# Patient Record
Sex: Female | Born: 1935
Health system: Southern US, Community
[De-identification: ages and names within clinical notes are randomized; demographics above are authoritative.]

## PROBLEM LIST (undated history)

## (undated) DIAGNOSIS — H3530 Unspecified macular degeneration: Secondary | ICD-10-CM

## (undated) DIAGNOSIS — R002 Palpitations: Secondary | ICD-10-CM

## (undated) DIAGNOSIS — I10 Essential (primary) hypertension: Secondary | ICD-10-CM

## (undated) DIAGNOSIS — K5792 Diverticulitis of intestine, part unspecified, without perforation or abscess without bleeding: Secondary | ICD-10-CM

## (undated) DIAGNOSIS — E785 Hyperlipidemia, unspecified: Secondary | ICD-10-CM

## (undated) HISTORY — PX: CHOLECYSTECTOMY: SHX55

## (undated) HISTORY — PX: ABDOMINAL HYSTERECTOMY: SHX81

## (undated) HISTORY — PX: OOPHORECTOMY: SHX86

## (undated) HISTORY — DX: Palpitations: R00.2

---

## 2007-05-20 LAB — HM COLONOSCOPY

## 2013-05-19 LAB — HM MAMMOGRAPHY: HM Mammogram: NORMAL (ref 0–4)

## 2014-06-24 ENCOUNTER — Encounter: Payer: Self-pay | Admitting: *Deleted

## 2014-06-24 ENCOUNTER — Emergency Department (INDEPENDENT_AMBULATORY_CARE_PROVIDER_SITE_OTHER)
Admission: EM | Admit: 2014-06-24 | Discharge: 2014-06-24 | Disposition: A | Discharge status: Home / Self Care | Payer: Medicare Other | Source: Home / Self Care | Attending: Emergency Medicine | Admitting: Emergency Medicine

## 2014-06-24 ENCOUNTER — Emergency Department (INDEPENDENT_AMBULATORY_CARE_PROVIDER_SITE_OTHER): Payer: Medicare Other

## 2014-06-24 DIAGNOSIS — S60221A Contusion of right hand, initial encounter: Secondary | ICD-10-CM

## 2014-06-24 DIAGNOSIS — M19041 Primary osteoarthritis, right hand: Secondary | ICD-10-CM

## 2014-06-24 HISTORY — DX: Unspecified macular degeneration: H35.30

## 2014-06-24 HISTORY — DX: Essential (primary) hypertension: I10

## 2014-06-24 HISTORY — DX: Hyperlipidemia, unspecified: E78.5

## 2014-06-24 HISTORY — DX: Diverticulitis of intestine, part unspecified, without perforation or abscess without bleeding: K57.92

## 2014-06-24 NOTE — Discharge Instructions (Signed)
Contusion °A contusion is a deep bruise. Contusions are the result of an injury that caused bleeding under the skin. The contusion may turn blue, purple, or yellow. Minor injuries will give you a painless contusion, but more severe contusions may stay painful and swollen for a few weeks.  °CAUSES  °A contusion is usually caused by a blow, trauma, or direct force to an area of the body. °SYMPTOMS  °· Swelling and redness of the injured area. °· Bruising of the injured area. °· Tenderness and soreness of the injured area. °· Pain. °DIAGNOSIS  °The diagnosis can be made by taking a history and physical exam. An X-ray, CT scan, or MRI may be needed to determine if there were any associated injuries, such as fractures. °TREATMENT  °Specific treatment will depend on what area of the body was injured. In general, the best treatment for a contusion is resting, icing, elevating, and applying cold compresses to the injured area. Over-the-counter medicines may also be recommended for pain control. Ask your caregiver what the best treatment is for your contusion. °HOME CARE INSTRUCTIONS  °· Put ice on the injured area. °¨ Put ice in a plastic bag. °¨ Place a towel between your skin and the bag. °¨ Leave the ice on for 15-20 minutes, 3-4 times a day, or as directed by your health care provider. °· Only take over-the-counter or prescription medicines for pain, discomfort, or fever as directed by your caregiver. Your caregiver may recommend avoiding anti-inflammatory medicines (aspirin, ibuprofen, and naproxen) for 48 hours because these medicines may increase bruising. °· Rest the injured area. °· If possible, elevate the injured area to reduce swelling. °SEEK IMMEDIATE MEDICAL CARE IF:  °· You have increased bruising or swelling. °· You have pain that is getting worse. °· Your swelling or pain is not relieved with medicines. °MAKE SURE YOU:  °· Understand these instructions. °· Will watch your condition. °· Will get help right  away if you are not doing well or get worse. °Document Released: 10/27/2004 Document Revised: 01/22/2013 Document Reviewed: 11/22/2010 °[employer]® Patient Information ©2015 [employer]. This information is not intended to replace advice given to you by your health care provider. Make sure you discuss any questions you have with your health care provider. ° °

## 2014-06-24 NOTE — ED Notes (Signed)
Pt c/o LT wrist and hand pain x 2 days post fall. She received a cortisone shot 9/15' in that hand for arthritis.

## 2014-06-24 NOTE — ED Provider Notes (Signed)
CSN: [id number]     Arrival date & time 06/24/14  1301 History   First MD Initiated Contact with Patient 06/24/14 1343     Chief Complaint  Patient presents with  . Hand Injury   (Consider location/radiation/quality/duration/timing/severity/associated sxs/prior Treatment) Patient is a 80 y.o. female presenting with hand pain. The history is provided by the patient. No language interpreter was used.  Hand Pain This is a new problem. The current episode started today. The problem occurs constantly. The problem has been resolved. Associated symptoms include joint swelling and myalgias. Nothing aggravates the symptoms. She has tried nothing for the symptoms. The treatment provided no relief.  Pt reports she fell 2 days ago and hit her wrist.  Pt complains of swelling and pain.  Past Medical History  Diagnosis Date  . Diverticulitis   . Hypertension   . Hyperlipidemia   . Macular degeneration    Past Surgical History  Procedure Laterality Date  . Abdominal hysterectomy    . Cholecystectomy    . Oophorectomy     History reviewed. No pertinent family history. History  Substance Use Topics  . Smoking status: Never Smoker   . Smokeless tobacco: Not on file  . Alcohol Use: No   OB History    No data available     Review of Systems  Musculoskeletal: Positive for myalgias and joint swelling.  All other systems reviewed and are negative.   Allergies  Amoxicillin; Ivp dye; and Sulfur  Home Medications   Prior to Admission medications   Medication Sig Start Date End Date Taking? Authorizing Provider  amikacin (AMIKIN) 0.4 mg/0.1 mL SOLN 0.4 mg by Intravitreal route to Surgery.   Yes Historical Provider, MD  amLODipine (NORVASC) 10 MG tablet Take 10 mg by mouth daily.   Yes Historical Provider, MD  aspirin 81 MG tablet Take 81 mg by mouth daily.   Yes Historical Provider, MD  folic acid (FOLVITE) 1 MG tablet Take 1 mg by mouth daily.   Yes Historical Provider, MD  metoprolol  (LOPRESSOR) 50 MG tablet Take 50 mg by mouth 2 (two) times daily.   Yes Historical Provider, MD  Multiple Vitamins-Minerals (OCUVITE PO) Take by mouth.   Yes Historical Provider, MD  pantoprazole (PROTONIX) 40 MG tablet Take 40 mg by mouth daily.   Yes Historical Provider, MD  pravastatin (PRAVACHOL) 20 MG tablet Take 20 mg by mouth daily.   Yes Historical Provider, MD   BP 146/81 mmHg  Pulse 74  Temp(Src) 98.5 F (36.9 C) (Oral)  Resp 18  Ht 5\' 5"  (1.651 m)  Wt 189 lb (85.73 kg)  BMI 31.45 kg/m2  SpO2 98% Physical Exam  Constitutional: She is oriented to person, place, and time. She appears well-developed and well-nourished.  HENT:  Head: Normocephalic.  Musculoskeletal: She exhibits tenderness.  Tender left thumb,  Tender left thenar prominence.   From,  nv and ns intact  Neurological: She is alert and oriented to person, place, and time. She has normal reflexes.  Skin: Skin is warm.  Psychiatric: She has a normal mood and affect.    ED Course  Procedures (including critical care time) Labs Review Labs Reviewed - No data to display  Imaging Review Dg Hand Complete Right  06/24/2014   CLINICAL DATA:  Acute right hand pain after fall 2 days ago. Initial encounter.  EXAM: RIGHT HAND - COMPLETE 3+ VIEW  COMPARISON:  None.  FINDINGS: There is no evidence of fracture or dislocation. Severe narrowing and  sclerosis of first metacarpophalangeal joint is noted. Soft tissues are unremarkable.  IMPRESSION: Severe osteoarthritis of first metacarpophalangeal joint. No acute abnormality seen in the right hand.   Electronically Signed   By: Marijo Conception, M.D.   On: 06/24/2014 14:13     MDM   1. Contusion of right hand, initial encounter    Wear brace Tylenol for pain See your Orthopaedist for recheck when you return to [facility].  AVS    Fransico Meadow, PA-C 06/27/14 [phone]

## 2015-02-01 HISTORY — PX: TUMOR REMOVAL: SHX12

## 2015-02-04 DIAGNOSIS — M4804 Spinal stenosis, thoracic region: Secondary | ICD-10-CM | POA: Diagnosis not present

## 2015-02-04 DIAGNOSIS — M546 Pain in thoracic spine: Secondary | ICD-10-CM | POA: Diagnosis not present

## 2015-02-04 DIAGNOSIS — M47814 Spondylosis without myelopathy or radiculopathy, thoracic region: Secondary | ICD-10-CM | POA: Diagnosis not present

## 2015-02-04 DIAGNOSIS — M549 Dorsalgia, unspecified: Secondary | ICD-10-CM | POA: Diagnosis not present

## 2015-03-05 DIAGNOSIS — R194 Change in bowel habit: Secondary | ICD-10-CM | POA: Diagnosis not present

## 2015-03-05 DIAGNOSIS — Z86010 Personal history of colonic polyps: Secondary | ICD-10-CM | POA: Diagnosis not present

## 2015-03-05 DIAGNOSIS — K219 Gastro-esophageal reflux disease without esophagitis: Secondary | ICD-10-CM | POA: Diagnosis not present

## 2015-03-11 DIAGNOSIS — H353231 Exudative age-related macular degeneration, bilateral, with active choroidal neovascularization: Secondary | ICD-10-CM | POA: Diagnosis not present

## 2015-03-11 DIAGNOSIS — H353221 Exudative age-related macular degeneration, left eye, with active choroidal neovascularization: Secondary | ICD-10-CM | POA: Diagnosis not present

## 2015-03-11 DIAGNOSIS — H353211 Exudative age-related macular degeneration, right eye, with active choroidal neovascularization: Secondary | ICD-10-CM | POA: Diagnosis not present

## 2015-03-19 DIAGNOSIS — Z6831 Body mass index (BMI) 31.0-31.9, adult: Secondary | ICD-10-CM | POA: Diagnosis not present

## 2015-03-19 DIAGNOSIS — Z1211 Encounter for screening for malignant neoplasm of colon: Secondary | ICD-10-CM | POA: Diagnosis not present

## 2015-03-19 DIAGNOSIS — Z1231 Encounter for screening mammogram for malignant neoplasm of breast: Secondary | ICD-10-CM | POA: Diagnosis not present

## 2015-03-19 DIAGNOSIS — H2513 Age-related nuclear cataract, bilateral: Secondary | ICD-10-CM | POA: Diagnosis not present

## 2015-03-19 DIAGNOSIS — Z13820 Encounter for screening for osteoporosis: Secondary | ICD-10-CM | POA: Diagnosis not present

## 2015-03-19 DIAGNOSIS — R21 Rash and other nonspecific skin eruption: Secondary | ICD-10-CM | POA: Diagnosis not present

## 2015-03-19 DIAGNOSIS — K219 Gastro-esophageal reflux disease without esophagitis: Secondary | ICD-10-CM | POA: Diagnosis not present

## 2015-03-19 DIAGNOSIS — I10 Essential (primary) hypertension: Secondary | ICD-10-CM | POA: Diagnosis not present

## 2015-03-19 DIAGNOSIS — M542 Cervicalgia: Secondary | ICD-10-CM | POA: Diagnosis not present

## 2015-03-19 DIAGNOSIS — I498 Other specified cardiac arrhythmias: Secondary | ICD-10-CM | POA: Diagnosis not present

## 2015-04-01 DIAGNOSIS — R194 Change in bowel habit: Secondary | ICD-10-CM | POA: Diagnosis not present

## 2015-04-01 DIAGNOSIS — Z86010 Personal history of colonic polyps: Secondary | ICD-10-CM | POA: Diagnosis not present

## 2015-04-01 DIAGNOSIS — K219 Gastro-esophageal reflux disease without esophagitis: Secondary | ICD-10-CM | POA: Diagnosis not present

## 2015-04-09 DIAGNOSIS — M50321 Other cervical disc degeneration at C4-C5 level: Secondary | ICD-10-CM | POA: Diagnosis not present

## 2015-04-09 DIAGNOSIS — M4312 Spondylolisthesis, cervical region: Secondary | ICD-10-CM | POA: Diagnosis not present

## 2015-04-09 DIAGNOSIS — M47896 Other spondylosis, lumbar region: Secondary | ICD-10-CM | POA: Diagnosis not present

## 2015-04-09 DIAGNOSIS — M542 Cervicalgia: Secondary | ICD-10-CM | POA: Diagnosis not present

## 2015-04-13 DIAGNOSIS — I10 Essential (primary) hypertension: Secondary | ICD-10-CM | POA: Diagnosis not present

## 2015-04-17 DIAGNOSIS — M546 Pain in thoracic spine: Secondary | ICD-10-CM | POA: Diagnosis not present

## 2015-04-17 DIAGNOSIS — Z0189 Encounter for other specified special examinations: Secondary | ICD-10-CM | POA: Diagnosis not present

## 2015-04-17 DIAGNOSIS — R937 Abnormal findings on diagnostic imaging of other parts of musculoskeletal system: Secondary | ICD-10-CM | POA: Diagnosis not present

## 2015-05-06 DIAGNOSIS — H353221 Exudative age-related macular degeneration, left eye, with active choroidal neovascularization: Secondary | ICD-10-CM | POA: Diagnosis not present

## 2015-05-06 DIAGNOSIS — H353211 Exudative age-related macular degeneration, right eye, with active choroidal neovascularization: Secondary | ICD-10-CM | POA: Diagnosis not present

## 2015-05-06 DIAGNOSIS — H353231 Exudative age-related macular degeneration, bilateral, with active choroidal neovascularization: Secondary | ICD-10-CM | POA: Diagnosis not present

## 2015-06-01 DIAGNOSIS — H3530 Unspecified macular degeneration: Secondary | ICD-10-CM | POA: Diagnosis not present

## 2015-06-10 DIAGNOSIS — D321 Benign neoplasm of spinal meninges: Secondary | ICD-10-CM | POA: Diagnosis not present

## 2015-07-01 DIAGNOSIS — M488X4 Other specified spondylopathies, thoracic region: Secondary | ICD-10-CM | POA: Diagnosis not present

## 2015-07-07 DIAGNOSIS — Z01810 Encounter for preprocedural cardiovascular examination: Secondary | ICD-10-CM | POA: Diagnosis not present

## 2015-07-07 DIAGNOSIS — M488X4 Other specified spondylopathies, thoracic region: Secondary | ICD-10-CM | POA: Diagnosis not present

## 2015-07-07 DIAGNOSIS — Z01818 Encounter for other preprocedural examination: Secondary | ICD-10-CM | POA: Diagnosis not present

## 2015-07-08 DIAGNOSIS — I10 Essential (primary) hypertension: Secondary | ICD-10-CM | POA: Diagnosis present

## 2015-07-08 DIAGNOSIS — M488X4 Other specified spondylopathies, thoracic region: Secondary | ICD-10-CM | POA: Diagnosis not present

## 2015-07-08 DIAGNOSIS — M62838 Other muscle spasm: Secondary | ICD-10-CM | POA: Diagnosis not present

## 2015-07-08 DIAGNOSIS — N3941 Urge incontinence: Secondary | ICD-10-CM | POA: Diagnosis present

## 2015-07-08 DIAGNOSIS — D3610 Benign neoplasm of peripheral nerves and autonomic nervous system, unspecified: Secondary | ICD-10-CM | POA: Diagnosis present

## 2015-07-08 DIAGNOSIS — G959 Disease of spinal cord, unspecified: Secondary | ICD-10-CM | POA: Diagnosis not present

## 2015-07-08 DIAGNOSIS — G9529 Other cord compression: Secondary | ICD-10-CM | POA: Diagnosis present

## 2015-07-08 DIAGNOSIS — Z6832 Body mass index (BMI) 32.0-32.9, adult: Secondary | ICD-10-CM | POA: Diagnosis not present

## 2015-07-08 DIAGNOSIS — K219 Gastro-esophageal reflux disease without esophagitis: Secondary | ICD-10-CM | POA: Diagnosis present

## 2015-07-23 DIAGNOSIS — Z4802 Encounter for removal of sutures: Secondary | ICD-10-CM | POA: Diagnosis not present

## 2015-08-03 DIAGNOSIS — H353221 Exudative age-related macular degeneration, left eye, with active choroidal neovascularization: Secondary | ICD-10-CM | POA: Diagnosis not present

## 2015-08-07 DIAGNOSIS — M5030 Other cervical disc degeneration, unspecified cervical region: Secondary | ICD-10-CM | POA: Diagnosis not present

## 2015-08-07 DIAGNOSIS — R5383 Other fatigue: Secondary | ICD-10-CM | POA: Diagnosis not present

## 2015-08-07 DIAGNOSIS — I495 Sick sinus syndrome: Secondary | ICD-10-CM | POA: Diagnosis not present

## 2015-08-07 DIAGNOSIS — I471 Supraventricular tachycardia: Secondary | ICD-10-CM | POA: Diagnosis not present

## 2015-08-07 DIAGNOSIS — R002 Palpitations: Secondary | ICD-10-CM | POA: Diagnosis not present

## 2015-08-07 DIAGNOSIS — I10 Essential (primary) hypertension: Secondary | ICD-10-CM | POA: Diagnosis not present

## 2015-08-11 DIAGNOSIS — M488X4 Other specified spondylopathies, thoracic region: Secondary | ICD-10-CM | POA: Diagnosis not present

## 2015-08-15 DIAGNOSIS — Z20818 Contact with and (suspected) exposure to other bacterial communicable diseases: Secondary | ICD-10-CM | POA: Diagnosis not present

## 2015-08-15 DIAGNOSIS — J029 Acute pharyngitis, unspecified: Secondary | ICD-10-CM | POA: Diagnosis not present

## 2015-08-15 DIAGNOSIS — Z9889 Other specified postprocedural states: Secondary | ICD-10-CM | POA: Diagnosis not present

## 2015-08-31 DIAGNOSIS — H6123 Impacted cerumen, bilateral: Secondary | ICD-10-CM | POA: Diagnosis not present

## 2015-10-26 DIAGNOSIS — H353231 Exudative age-related macular degeneration, bilateral, with active choroidal neovascularization: Secondary | ICD-10-CM | POA: Diagnosis not present

## 2015-10-26 DIAGNOSIS — H353221 Exudative age-related macular degeneration, left eye, with active choroidal neovascularization: Secondary | ICD-10-CM | POA: Diagnosis not present

## 2015-12-02 DIAGNOSIS — Z23 Encounter for immunization: Secondary | ICD-10-CM | POA: Diagnosis not present

## 2016-01-05 DIAGNOSIS — E039 Hypothyroidism, unspecified: Secondary | ICD-10-CM | POA: Diagnosis not present

## 2016-01-05 DIAGNOSIS — E785 Hyperlipidemia, unspecified: Secondary | ICD-10-CM | POA: Diagnosis not present

## 2016-01-05 DIAGNOSIS — I10 Essential (primary) hypertension: Secondary | ICD-10-CM | POA: Diagnosis not present

## 2016-01-05 LAB — LIPID PANEL
CHOLESTEROL: 165 mg/dL (ref 0–200)
HDL: 53 mg/dL (ref 35–70)
LDL CALC: 90 mg/dL
Triglycerides: 108 mg/dL (ref 40–160)

## 2016-01-05 LAB — CBC AND DIFFERENTIAL
HEMATOCRIT: 45 % (ref 36–46)
Hemoglobin: 14.7 g/dL (ref 12.0–16.0)
NEUTROS ABS: 5 /uL
Platelets: 243 10*3/uL (ref 150–399)
WBC: 6.8 10*3/mL

## 2016-01-05 LAB — BASIC METABOLIC PANEL
BUN: 14 mg/dL (ref 4–21)
Creatinine: 0.8 mg/dL (ref 0.5–1.1)
GLUCOSE: 88 mg/dL
Potassium: 4.4 mmol/L (ref 3.4–5.3)
Sodium: 145 mmol/L (ref 137–147)

## 2016-01-05 LAB — HEPATIC FUNCTION PANEL
ALT: 13 U/L (ref 7–35)
AST: 20 U/L (ref 13–35)
Alkaline Phosphatase: 88 U/L (ref 25–125)
Bilirubin, Total: 1.4 mg/dL

## 2016-01-12 DIAGNOSIS — E7800 Pure hypercholesterolemia, unspecified: Secondary | ICD-10-CM | POA: Diagnosis not present

## 2016-01-12 DIAGNOSIS — K219 Gastro-esophageal reflux disease without esophagitis: Secondary | ICD-10-CM | POA: Diagnosis not present

## 2016-01-12 DIAGNOSIS — I10 Essential (primary) hypertension: Secondary | ICD-10-CM | POA: Diagnosis not present

## 2016-01-12 DIAGNOSIS — I498 Other specified cardiac arrhythmias: Secondary | ICD-10-CM | POA: Diagnosis not present

## 2016-01-12 DIAGNOSIS — Z23 Encounter for immunization: Secondary | ICD-10-CM | POA: Diagnosis not present

## 2016-01-18 DIAGNOSIS — H353211 Exudative age-related macular degeneration, right eye, with active choroidal neovascularization: Secondary | ICD-10-CM | POA: Diagnosis not present

## 2016-01-18 DIAGNOSIS — H353221 Exudative age-related macular degeneration, left eye, with active choroidal neovascularization: Secondary | ICD-10-CM | POA: Diagnosis not present

## 2016-01-18 DIAGNOSIS — H353231 Exudative age-related macular degeneration, bilateral, with active choroidal neovascularization: Secondary | ICD-10-CM | POA: Diagnosis not present

## 2016-01-29 DIAGNOSIS — D1801 Hemangioma of skin and subcutaneous tissue: Secondary | ICD-10-CM | POA: Diagnosis not present

## 2016-01-29 DIAGNOSIS — D045 Carcinoma in situ of skin of trunk: Secondary | ICD-10-CM | POA: Diagnosis not present

## 2016-01-29 DIAGNOSIS — L821 Other seborrheic keratosis: Secondary | ICD-10-CM | POA: Diagnosis not present

## 2016-01-29 DIAGNOSIS — L720 Epidermal cyst: Secondary | ICD-10-CM | POA: Diagnosis not present

## 2016-01-29 DIAGNOSIS — D485 Neoplasm of uncertain behavior of skin: Secondary | ICD-10-CM | POA: Diagnosis not present

## 2016-02-04 DIAGNOSIS — H401111 Primary open-angle glaucoma, right eye, mild stage: Secondary | ICD-10-CM | POA: Diagnosis not present

## 2016-02-04 DIAGNOSIS — H401123 Primary open-angle glaucoma, left eye, severe stage: Secondary | ICD-10-CM | POA: Diagnosis not present

## 2016-02-17 DIAGNOSIS — H401123 Primary open-angle glaucoma, left eye, severe stage: Secondary | ICD-10-CM | POA: Diagnosis not present

## 2016-02-22 DIAGNOSIS — D045 Carcinoma in situ of skin of trunk: Secondary | ICD-10-CM | POA: Diagnosis not present

## 2016-02-29 DIAGNOSIS — H401123 Primary open-angle glaucoma, left eye, severe stage: Secondary | ICD-10-CM | POA: Diagnosis not present

## 2016-03-16 DIAGNOSIS — H6123 Impacted cerumen, bilateral: Secondary | ICD-10-CM | POA: Diagnosis not present

## 2016-03-16 DIAGNOSIS — H903 Sensorineural hearing loss, bilateral: Secondary | ICD-10-CM | POA: Diagnosis not present

## 2016-04-11 DIAGNOSIS — H353211 Exudative age-related macular degeneration, right eye, with active choroidal neovascularization: Secondary | ICD-10-CM | POA: Diagnosis not present

## 2016-04-11 DIAGNOSIS — H353221 Exudative age-related macular degeneration, left eye, with active choroidal neovascularization: Secondary | ICD-10-CM | POA: Diagnosis not present

## 2016-04-11 DIAGNOSIS — H353231 Exudative age-related macular degeneration, bilateral, with active choroidal neovascularization: Secondary | ICD-10-CM | POA: Diagnosis not present

## 2016-04-15 DIAGNOSIS — H401123 Primary open-angle glaucoma, left eye, severe stage: Secondary | ICD-10-CM | POA: Diagnosis not present

## 2016-05-19 ENCOUNTER — Encounter: Payer: Self-pay | Admitting: Family Medicine

## 2016-05-19 ENCOUNTER — Ambulatory Visit (INDEPENDENT_AMBULATORY_CARE_PROVIDER_SITE_OTHER): Payer: Medicare Other | Admitting: Family Medicine

## 2016-05-19 VITALS — BP 135/58 | HR 65 | Ht 64.0 in | Wt 172.0 lb

## 2016-05-19 DIAGNOSIS — I10 Essential (primary) hypertension: Secondary | ICD-10-CM | POA: Diagnosis not present

## 2016-05-19 DIAGNOSIS — K2100 Gastro-esophageal reflux disease with esophagitis, without bleeding: Secondary | ICD-10-CM

## 2016-05-19 DIAGNOSIS — R002 Palpitations: Secondary | ICD-10-CM | POA: Diagnosis not present

## 2016-05-19 DIAGNOSIS — H409 Unspecified glaucoma: Secondary | ICD-10-CM

## 2016-05-19 DIAGNOSIS — E785 Hyperlipidemia, unspecified: Secondary | ICD-10-CM

## 2016-05-19 MED ORDER — PANTOPRAZOLE SODIUM 20 MG PO TBEC: 20.0000 mg | DELAYED_RELEASE_TABLET | Freq: Every day | ORAL | 3 refills | Status: DC

## 2016-05-19 NOTE — Progress Notes (Signed)
Subjective:    Patient ID: Misty Dennis, female    DOB: 12/29/1934, 82 y.o.   MRN: [id number]  HPI 82 year old female comes in today to establish care. She has a history of hypertension as well as palpitations. She has seen cardiologist in the past. She's currently on metoprolol 50 mg and Norvasc 10 mg and has been on this regimen for quite some time. She tolerates it well without any side effects or problems. She's not had any recent chest pain or problems.  GERD-she's been on pantoprazole 40 mg for years. She tried taking it every other day but started to get more reflux symptoms. She said she's had stomach palms her whole life. Recently she has been trying to cut back on her caffeine intake as well as her chocolate intake.  Hyperlipidemia-currently on pravastatin again she's been on that for quite some time. She denies any side effects or myalgias.  She does take a folic acid daily. She says at one point she was told to take it and so has actually been taking it for years. She's not aware of any specific help condition for which she actually uses it.  She also reports multiple eye problems including glaucoma and cataracts. She is currently on several eyedrops and actually has an appointment with a specialist on Monday.    Review of Systems  Constitutional: Negative for diaphoresis, fever and unexpected weight change.       + allergies  HENT: Negative for hearing loss, rhinorrhea and tinnitus.   Eyes: Positive for visual disturbance.  Respiratory: Negative for cough and wheezing.   Cardiovascular: Positive for palpitations. Negative for chest pain.  Gastrointestinal: Negative for blood in stool, diarrhea, nausea and vomiting.  Genitourinary: Negative for difficulty urinating, vaginal bleeding and vaginal discharge.       Occ nausea   Musculoskeletal: Positive for arthralgias and myalgias.  Skin: Negative for rash.  Neurological: Negative for headaches.  Hematological: Negative  for adenopathy. Does not bruise/bleed easily.  Psychiatric/Behavioral: Negative for dysphoric mood and sleep disturbance. The patient is not nervous/anxious.    BP (!) 135/58   Pulse 65   Ht 5\' 4"  (1.626 m)   Wt 172 lb (78 kg)   BMI 29.52 kg/m     Allergies  Allergen Reactions  . Amoxicillin   . Ivp Dye [Iodinated Diagnostic Agents]   . Sulfur     Past Medical History:  Diagnosis Date  . Diverticulitis   . Hyperlipidemia   . Hypertension   . Macular degeneration   . Palpitations     Past Surgical History:  Procedure Laterality Date  . ABDOMINAL HYSTERECTOMY    . CHOLECYSTECTOMY    . OOPHORECTOMY    . TUMOR REMOVAL  2017   [facility], removed from Spine.     Social History   Social History  . Marital status: Married    Spouse name: [name]  . Number of children: 3  . Years of education: 12   Occupational History  . Retired    Social History Main Topics  . Smoking status: Never Smoker  . Smokeless tobacco: Never Used  . Alcohol use No  . Drug use: No  . Sexual activity: Not Currently    Partners: Male   Other Topics Concern  . Not on file   Social History Narrative   No regular exercise. 1 caffeinated drink per day.    Family History  Problem Relation Age of Onset  . Hyperlipidemia Mother   .  Stroke Mother   . Hypertension Mother   . Alcoholism Father     Outpatient Encounter Prescriptions as of 05/19/2016  Medication Sig  . amLODipine (NORVASC) 10 MG tablet Take 10 mg by mouth daily.  Marland Kitchen aspirin 81 MG tablet Take 81 mg by mouth daily.  . Brimonidine Tartrate-Timolol (COMBIGAN OP) Apply to eye 2 (two) times daily.  . folic acid (FOLVITE) 1 MG tablet Take 1 mg by mouth daily.  Marland Kitchen LATANOPROST OP Apply to eye. One drop in left eye  . metoprolol (LOPRESSOR) 50 MG tablet Take 50 mg by mouth 2 (two) times daily.  . pravastatin (PRAVACHOL) 20 MG tablet Take 20 mg by mouth daily.  . [DISCONTINUED] pantoprazole (PROTONIX) 40 MG tablet Take 40 mg by mouth  daily.  . Multiple Vitamins-Minerals (OCUVITE PO) Take by mouth.  . pantoprazole (PROTONIX) 20 MG tablet Take 1 tablet (20 mg total) by mouth daily.  . [DISCONTINUED] amikacin (AMIKIN) 0.4 mg/0.1 mL SOLN 0.4 mg by Intravitreal route to Surgery.   No facility-administered encounter medications on file as of 05/19/2016.          Objective:   Physical Exam  Constitutional: She is oriented to person, place, and time. She appears well-developed and well-nourished.  HENT:  Head: Normocephalic and atraumatic.  Cardiovascular: Normal rate, regular rhythm and normal heart sounds.   Pulmonary/Chest: Effort normal and breath sounds normal.  Neurological: She is alert and oriented to person, place, and time.  Skin: Skin is warm and dry.  Psychiatric: She has a normal mood and affect. Her behavior is normal.        Assessment & Plan:  HTN - Well controlled. Continue current regimen. Follow up in  6 months.    GERD- and try decreasing pantoprazole down to 20 mg to see if she tolerates this well. Will send over new prescription to [facility].  Hyperlipidemia-currently on pravastatin. Could consider switching to atorvastatin.  Glaucoma - Has eye point with specialist on Monday.  History of palpitations-well controlled on beta blocker.

## 2016-05-23 DIAGNOSIS — H401111 Primary open-angle glaucoma, right eye, mild stage: Secondary | ICD-10-CM | POA: Diagnosis not present

## 2016-05-23 DIAGNOSIS — H524 Presbyopia: Secondary | ICD-10-CM | POA: Diagnosis not present

## 2016-05-23 DIAGNOSIS — H353232 Exudative age-related macular degeneration, bilateral, with inactive choroidal neovascularization: Secondary | ICD-10-CM | POA: Diagnosis not present

## 2016-05-23 DIAGNOSIS — H25813 Combined forms of age-related cataract, bilateral: Secondary | ICD-10-CM | POA: Diagnosis not present

## 2016-05-24 ENCOUNTER — Encounter: Payer: Self-pay | Admitting: Family Medicine

## 2016-05-24 NOTE — Progress Notes (Unsigned)
01/05/16 

## 2016-06-02 DIAGNOSIS — H25811 Combined forms of age-related cataract, right eye: Secondary | ICD-10-CM | POA: Diagnosis not present

## 2016-06-02 DIAGNOSIS — H2511 Age-related nuclear cataract, right eye: Secondary | ICD-10-CM | POA: Diagnosis not present

## 2016-07-07 DIAGNOSIS — H25812 Combined forms of age-related cataract, left eye: Secondary | ICD-10-CM | POA: Diagnosis not present

## 2016-07-07 DIAGNOSIS — H268 Other specified cataract: Secondary | ICD-10-CM | POA: Diagnosis not present

## 2016-07-20 DIAGNOSIS — H353231 Exudative age-related macular degeneration, bilateral, with active choroidal neovascularization: Secondary | ICD-10-CM | POA: Diagnosis not present

## 2016-07-20 DIAGNOSIS — H43813 Vitreous degeneration, bilateral: Secondary | ICD-10-CM | POA: Diagnosis not present

## 2016-08-04 DIAGNOSIS — H353231 Exudative age-related macular degeneration, bilateral, with active choroidal neovascularization: Secondary | ICD-10-CM | POA: Diagnosis not present

## 2016-08-16 ENCOUNTER — Encounter: Payer: Self-pay | Admitting: Family Medicine

## 2016-10-18 ENCOUNTER — Ambulatory Visit (INDEPENDENT_AMBULATORY_CARE_PROVIDER_SITE_OTHER): Payer: Medicare Other | Admitting: Family Medicine

## 2016-10-18 VITALS — BP 110/58 | HR 56 | Temp 98.5°F | Wt 178.8 lb

## 2016-10-18 DIAGNOSIS — Z23 Encounter for immunization: Secondary | ICD-10-CM | POA: Diagnosis not present

## 2016-10-18 DIAGNOSIS — I10 Essential (primary) hypertension: Secondary | ICD-10-CM

## 2016-10-18 NOTE — Progress Notes (Addendum)
   Subjective:    Patient ID: Misty Dennis, female    DOB: 07-05-1934, 82 y.o.   MRN: [id number]  HPI Pt is here for flu vaccine. Pt denies egg allergy, fever, chest pain, SOB, or any other problems.    Review of Systems     Objective:   Physical Exam        Assessment & Plan:  Pt tolerated injection in left deltoid well and without complications.  HTN - Blood pressure actually little low today. Decrease amlodipine to half tab daily and keep the appointment in one month.

## 2016-11-03 DIAGNOSIS — H353231 Exudative age-related macular degeneration, bilateral, with active choroidal neovascularization: Secondary | ICD-10-CM | POA: Diagnosis not present

## 2016-11-03 DIAGNOSIS — H43813 Vitreous degeneration, bilateral: Secondary | ICD-10-CM | POA: Diagnosis not present

## 2016-11-13 DIAGNOSIS — G44309 Post-traumatic headache, unspecified, not intractable: Secondary | ICD-10-CM | POA: Diagnosis not present

## 2016-11-13 DIAGNOSIS — Z91041 Radiographic dye allergy status: Secondary | ICD-10-CM | POA: Diagnosis not present

## 2016-11-13 DIAGNOSIS — Z888 Allergy status to other drugs, medicaments and biological substances status: Secondary | ICD-10-CM | POA: Diagnosis not present

## 2016-11-13 DIAGNOSIS — I6782 Cerebral ischemia: Secondary | ICD-10-CM | POA: Diagnosis not present

## 2016-11-13 DIAGNOSIS — W010XXA Fall on same level from slipping, tripping and stumbling without subsequent striking against object, initial encounter: Secondary | ICD-10-CM | POA: Diagnosis not present

## 2016-11-13 DIAGNOSIS — S7002XA Contusion of left hip, initial encounter: Secondary | ICD-10-CM | POA: Diagnosis not present

## 2016-11-13 DIAGNOSIS — G319 Degenerative disease of nervous system, unspecified: Secondary | ICD-10-CM | POA: Diagnosis not present

## 2016-11-13 DIAGNOSIS — I10 Essential (primary) hypertension: Secondary | ICD-10-CM | POA: Diagnosis not present

## 2016-11-13 DIAGNOSIS — S0990XA Unspecified injury of head, initial encounter: Secondary | ICD-10-CM | POA: Diagnosis not present

## 2016-11-13 DIAGNOSIS — Z882 Allergy status to sulfonamides status: Secondary | ICD-10-CM | POA: Diagnosis not present

## 2016-11-13 DIAGNOSIS — S79912A Unspecified injury of left hip, initial encounter: Secondary | ICD-10-CM | POA: Diagnosis not present

## 2016-11-13 DIAGNOSIS — M160 Bilateral primary osteoarthritis of hip: Secondary | ICD-10-CM | POA: Diagnosis not present

## 2016-11-18 ENCOUNTER — Encounter: Payer: Self-pay | Admitting: Family Medicine

## 2016-11-18 ENCOUNTER — Ambulatory Visit (INDEPENDENT_AMBULATORY_CARE_PROVIDER_SITE_OTHER): Payer: Medicare Other | Admitting: Family Medicine

## 2016-11-18 ENCOUNTER — Telehealth: Payer: Self-pay | Admitting: Family Medicine

## 2016-11-18 VITALS — BP 157/62 | HR 64 | Wt 180.0 lb

## 2016-11-18 DIAGNOSIS — R002 Palpitations: Secondary | ICD-10-CM

## 2016-11-18 DIAGNOSIS — M170 Bilateral primary osteoarthritis of knee: Secondary | ICD-10-CM | POA: Diagnosis not present

## 2016-11-18 DIAGNOSIS — M19049 Primary osteoarthritis, unspecified hand: Secondary | ICD-10-CM

## 2016-11-18 DIAGNOSIS — K2100 Gastro-esophageal reflux disease with esophagitis, without bleeding: Secondary | ICD-10-CM

## 2016-11-18 DIAGNOSIS — I10 Essential (primary) hypertension: Secondary | ICD-10-CM

## 2016-11-18 MED ORDER — PRAVASTATIN SODIUM 20 MG PO TABS: 20.0000 mg | ORAL_TABLET | Freq: Every day | ORAL | 1 refills | Status: DC

## 2016-11-18 MED ORDER — AMLODIPINE BESYLATE 10 MG PO TABS: 10.0000 mg | ORAL_TABLET | Freq: Every day | ORAL | 1 refills | Status: DC

## 2016-11-18 MED ORDER — METOPROLOL TARTRATE 50 MG PO TABS: 50.0000 mg | ORAL_TABLET | Freq: Two times a day (BID) | ORAL | 1 refills | Status: DC

## 2016-11-18 MED ORDER — AMBULATORY NON FORMULARY MEDICATION: 0 refills | Status: DC

## 2016-11-18 NOTE — Progress Notes (Signed)
Subjective:    Patient ID: Misty Dennis, female    DOB: [birth year], 82 y.o.   MRN: [id number]  HPI  Hypertension- Pt denies chest pain, SOB, dizziness, or heart palpitations.  Taking meds as directed w/o problems.  Denies medication side effects.  We vaccine decreased her amlodipine down to 5 mg when I saw her last office visit.  GERD- has been able to decrease to the 20mg  protonix and it is working well.   She's been a little stressed and overwhelmed with her husband has Parkinson's disease. He's fallen twice in a short period of time in fact they had to go to urgent care. They thought he had an appointment with me today but he actually did not.  She also wanted to discuss some strategies around her arthritis. She has some pretty severe osteoporosis at the base of the right thumb as well as in her knees. She says at times it can be painful and sore. Right now it's not super bothersome. She has had an injection at the base of the thumb before and says it was extremely painful. She has never had any type of interventional treatment with the knees. She mostly applies Biofreeze at home and says that she does get some temporary relief with that.  She occasionally gets palpitations. She said she was placed on metoprolol years ago for an irregular heartbeat. She says that she gets the episodes sometimes a week or more apart but sometimes can happen multiple times in one week. They usually just last a couple minutes when it occurs and she feels like her heart is of them is skipping a beat. No chest pain with it.   Review of Systems   BP (!) 157/62   Pulse 64   Wt 180 lb (81.6 kg)   BMI 30.90 kg/m     Allergies  Allergen Reactions  . Amoxicillin   . Ivp Dye [Iodinated Diagnostic Agents]   . Sulfur     Past Medical History:  Diagnosis Date  . Diverticulitis   . Hyperlipidemia   . Hypertension   . Macular degeneration   . Palpitations     Past Surgical History:  Procedure Laterality  Date  . ABDOMINAL HYSTERECTOMY    . CHOLECYSTECTOMY    . OOPHORECTOMY    . TUMOR REMOVAL  2017   [facility] clinic, removed from Spine.     Social History   Social History  . Marital status: Married    Spouse name: [name]  . Number of children: 3  . Years of education: 12   Occupational History  . Retired    Social History Main Topics  . Smoking status: Never Smoker  . Smokeless tobacco: Never Used  . Alcohol use No  . Drug use: No  . Sexual activity: Not Currently    Partners: Male   Other Topics Concern  . Not on file   Social History Narrative   No regular exercise. 1 caffeinated drink per day.    Family History  Problem Relation Age of Onset  . Hyperlipidemia Mother   . Stroke Mother   . Hypertension Mother   . Alcoholism Father     Outpatient Encounter Prescriptions as of 11/18/2016  Medication Sig  . amLODipine (NORVASC) 10 MG tablet Take 1 tablet (10 mg total) by mouth daily.  Marland Kitchen aspirin 81 MG tablet Take 81 mg by mouth daily.  . Brimonidine Tartrate-Timolol (COMBIGAN OP) Apply to eye 2 (two) times daily.  . folic  acid (FOLVITE) 1 MG tablet Take 1 mg by mouth daily.  . metoprolol tartrate (LOPRESSOR) 50 MG tablet Take 1 tablet (50 mg total) by mouth 2 (two) times daily.  . Multiple Vitamins-Minerals (OCUVITE PO) Take by mouth.  . pantoprazole (PROTONIX) 20 MG tablet Take 1 tablet (20 mg total) by mouth daily.  . pravastatin (PRAVACHOL) 20 MG tablet Take 1 tablet (20 mg total) by mouth daily.  . [DISCONTINUED] amLODipine (NORVASC) 10 MG tablet Take 10 mg by mouth daily.  . [DISCONTINUED] metoprolol (LOPRESSOR) 50 MG tablet Take 50 mg by mouth 2 (two) times daily.  . [DISCONTINUED] pravastatin (PRAVACHOL) 20 MG tablet Take 20 mg by mouth daily.  . AMBULATORY NON FORMULARY MEDICATION Medication Name: Home Blood pressure cuff.  Dx. Hypertension. FAx to [facility]  . [DISCONTINUED] LATANOPROST OP Apply to eye. One drop in left eye   No facility-administered encounter  medications on file as of 11/18/2016.           Objective:   Physical Exam  Constitutional: She is oriented to person, place, and time. She appears well-developed and well-nourished.  HENT:  Head: Normocephalic and atraumatic.  Cardiovascular: Normal rate, regular rhythm and normal heart sounds.   Pulmonary/Chest: Effort normal and breath sounds normal.  Neurological: She is alert and oriented to person, place, and time.  Skin: Skin is warm and dry.  Psychiatric: She has a normal mood and affect. Her behavior is normal.       Assessment & Plan:  HTN - BP not at goal.  Plan to buy a new blood pressure cuff. Encouraged her to buy an upper arm cuff and not a wrist cuff to track her pressures at home and then bring in the log as well as the blood pressure machine at her follow-up visit. Recheck with nurse in 2 weeks.  Medications refill. It looked great 6 months. Due for CMP and lipid panel. Labs ordered. Continue with 5 mg of amlodipine until we can get her blood pressures checked at home. We cannot get back up to the 10 mg dose which is what she was producing taken if needed.  GERD- Continue with 20mg  daily.    Osteoporosis-we discussed regular exercise and stretches. Using a firm ball to squeeze to strengthen the hand muscles. Certainly if the Biofreeze helpful she can continue that as well and if her pain persists or gets worse and she may be a candidate for an injection. I gave her the information for her to sports medicine docs could always take a further look at her knees and thumb if needed.  Palpitations-we'll keep an eye on things. Offered to schedule her for a 30 day heart monitor particularly if her symptoms seem more frequent or persist. We could always adjust her metoprolol if needed as well.

## 2016-11-18 NOTE — Telephone Encounter (Signed)
Call pt: please cal and ask her to f/u in 2 weeks for nurse visit since her BP was a little high today.

## 2016-11-22 NOTE — Telephone Encounter (Signed)
Patient notified

## 2016-12-05 ENCOUNTER — Ambulatory Visit (INDEPENDENT_AMBULATORY_CARE_PROVIDER_SITE_OTHER): Payer: Medicare Other | Admitting: Family Medicine

## 2016-12-05 VITALS — BP 141/64 | HR 63

## 2016-12-05 DIAGNOSIS — I10 Essential (primary) hypertension: Secondary | ICD-10-CM | POA: Diagnosis not present

## 2016-12-05 NOTE — Progress Notes (Signed)
Pt in for bp check.  Pt brought in her home cuff and the reading was 162/87.  Our machine read 141/64.  Pt states that her Amlodipine was reduced to 5mg  qd.

## 2016-12-05 NOTE — Progress Notes (Signed)
Notified pt to start taking full tab of her Amlodipine and to come back in 2-3 weeks for recheck.

## 2016-12-05 NOTE — Progress Notes (Signed)
Hypertension-uncontrolled.  We will have her go ahead and go back up to 10 mg of the amlodipine.  Okay to send over new prescription if needed for 90-day supply.  Have her come back in 2-3 weeks for repeat blood pressure check if she is able.  Beatrice Lecher, MD

## 2017-02-06 DIAGNOSIS — H353231 Exudative age-related macular degeneration, bilateral, with active choroidal neovascularization: Secondary | ICD-10-CM | POA: Diagnosis not present

## 2017-02-06 DIAGNOSIS — H43813 Vitreous degeneration, bilateral: Secondary | ICD-10-CM | POA: Diagnosis not present

## 2017-02-13 ENCOUNTER — Other Ambulatory Visit: Payer: Self-pay | Admitting: Family Medicine

## 2017-02-14 DIAGNOSIS — H401112 Primary open-angle glaucoma, right eye, moderate stage: Secondary | ICD-10-CM | POA: Diagnosis not present

## 2017-02-14 DIAGNOSIS — H401423 Capsular glaucoma with pseudoexfoliation of lens, left eye, severe stage: Secondary | ICD-10-CM | POA: Diagnosis not present

## 2017-03-13 DIAGNOSIS — M79674 Pain in right toe(s): Secondary | ICD-10-CM | POA: Diagnosis not present

## 2017-03-13 DIAGNOSIS — M79675 Pain in left toe(s): Secondary | ICD-10-CM | POA: Diagnosis not present

## 2017-03-13 DIAGNOSIS — L600 Ingrowing nail: Secondary | ICD-10-CM | POA: Diagnosis not present

## 2017-03-13 DIAGNOSIS — L859 Epidermal thickening, unspecified: Secondary | ICD-10-CM | POA: Diagnosis not present

## 2017-03-21 ENCOUNTER — Telehealth: Payer: Self-pay

## 2017-03-21 MED ORDER — OSELTAMIVIR PHOSPHATE 75 MG PO CAPS: 75.0000 mg | ORAL_CAPSULE | Freq: Every day | ORAL | 0 refills | Status: DC

## 2017-03-21 NOTE — Telephone Encounter (Signed)
Sent the Tamiflu.

## 2017-03-21 NOTE — Telephone Encounter (Signed)
Patient advised.

## 2017-03-21 NOTE — Telephone Encounter (Signed)
Misty Dennis called and she states her husband has been admitted to the hospital with the flu. The only symptoms she has is a scratchy throat. She would like tamiflu sent to pharmacy. Last labs in 2017. Please advise.   CMP Latest Ref Rng & Units 01/05/2016  BUN 4 - 21 mg/dL 14  Creatinine 0.5 - 1.1 mg/dL 0.8  Sodium 137 - 147 mmol/L 145  Potassium 3.4 - 5.3 mmol/L 4.4  Alkaline Phos 25 - 125 U/L 88  AST 13 - 35 U/L 20  ALT 7 - 35 U/L 13

## 2017-04-03 ENCOUNTER — Ambulatory Visit (INDEPENDENT_AMBULATORY_CARE_PROVIDER_SITE_OTHER): Payer: Medicare Other | Admitting: Sports Medicine

## 2017-04-03 ENCOUNTER — Ambulatory Visit (INDEPENDENT_AMBULATORY_CARE_PROVIDER_SITE_OTHER): Payer: Medicare Other

## 2017-04-03 DIAGNOSIS — M1711 Unilateral primary osteoarthritis, right knee: Secondary | ICD-10-CM | POA: Diagnosis not present

## 2017-04-03 DIAGNOSIS — M76892 Other specified enthesopathies of left lower limb, excluding foot: Secondary | ICD-10-CM | POA: Diagnosis not present

## 2017-04-03 DIAGNOSIS — M1811 Unilateral primary osteoarthritis of first carpometacarpal joint, right hand: Secondary | ICD-10-CM

## 2017-04-03 DIAGNOSIS — M19041 Primary osteoarthritis, right hand: Secondary | ICD-10-CM | POA: Diagnosis not present

## 2017-04-03 DIAGNOSIS — M76891 Other specified enthesopathies of right lower limb, excluding foot: Secondary | ICD-10-CM | POA: Diagnosis not present

## 2017-04-03 DIAGNOSIS — M170 Bilateral primary osteoarthritis of knee: Secondary | ICD-10-CM | POA: Insufficient documentation

## 2017-04-03 MED ORDER — MELOXICAM 15 MG PO TABS: ORAL_TABLET | ORAL | 3 refills | Status: DC

## 2017-04-03 NOTE — Assessment & Plan Note (Signed)
Meloxicam, x-rays, injection. Home rehab exercises given. Reaction knee brace. Return to see me in a month.

## 2017-04-03 NOTE — Progress Notes (Signed)
Subjective:    I'm seeing this patient as a consultation for: Dr. Beatrice Lecher  CC: Thumb pain, knee pain  HPI: Right thumb pain: Severe, persistent, localized at the thumb basal joint, she has had an injection several years ago that provided good relief.  She takes Tylenol arthritis strength but no NSAIDs.  No contraindications.  Right knee pain: Moderate, persistent, localized to the medial joint line, positive gelling, no mechanical symptoms, no trauma.  I reviewed the past medical history, family history, social history, surgical history, and allergies today and no changes were needed.  Please see the problem list section below in epic for further details.  Past Medical History: Past Medical History:  Diagnosis Date  . Diverticulitis   . Hyperlipidemia   . Hypertension   . Macular degeneration   . Palpitations    Past Surgical History: Past Surgical History:  Procedure Laterality Date  . ABDOMINAL HYSTERECTOMY    . CHOLECYSTECTOMY    . OOPHORECTOMY    . TUMOR REMOVAL  2017   May clinic, removed from Spine.    Social History: Social History   Socioeconomic History  . Marital status: Married    Spouse name: [name]  . Number of children: 3  . Years of education: 54  . Highest education level: Not on file  Social Needs  . Financial resource strain: Not on file  . Food insecurity - worry: Not on file  . Food insecurity - inability: Not on file  . Transportation needs - medical: Not on file  . Transportation needs - non-medical: Not on file  Occupational History  . Occupation: Retired  Tobacco Use  . Smoking status: Never Smoker  . Smokeless tobacco: Never Used  Substance and Sexual Activity  . Alcohol use: No  . Drug use: No  . Sexual activity: Not Currently    Partners: Male  Other Topics Concern  . Not on file  Social History Narrative   No regular exercise. 1 caffeinated drink per day.   Family History: Family History  Problem Relation Age of  Onset  . Hyperlipidemia Mother   . Stroke Mother   . Hypertension Mother   . Alcoholism Father    Allergies: Allergies  Allergen Reactions  . Amoxicillin   . Ivp Dye [Iodinated Diagnostic Agents]   . Sulfur    Medications: See med rec.  Review of Systems: No headache, visual changes, nausea, vomiting, diarrhea, constipation, dizziness, abdominal pain, skin rash, fevers, chills, night sweats, weight loss, swollen lymph nodes, body aches, joint swelling, muscle aches, chest pain, shortness of breath, mood changes, visual or auditory hallucinations.   Objective:   General: Well Developed, well nourished, and in no acute distress.  Neuro:  Extra-ocular muscles intact, able to move all 4 extremities, sensation grossly intact.  Deep tendon reflexes tested were normal. Psych: Alert and oriented, mood congruent with affect. ENT:  Ears and nose appear unremarkable.  Hearing grossly normal. Neck: Unremarkable overall appearance, trachea midline.  No visible thyroid enlargement. Eyes: Conjunctivae and lids appear unremarkable.  Pupils equal and round. Skin: Warm and dry, no rashes noted.  Cardiovascular: Pulses palpable, no extremity edema. Right knee: Normal to inspection with no erythema or effusion or obvious bony abnormalities. Tender to palpation at the medial joint line ROM normal in flexion and extension and lower leg rotation. Ligaments with solid consistent endpoints including ACL, PCL, LCL, MCL. Negative Mcmurray's and provocative meniscal tests. Non painful patellar compression. Patellar and quadriceps tendons unremarkable. Hamstring and  quadriceps strength is normal. Right hand: Squared off appearance of the thumb basal joint, tenderness at the trapeziometacarpal joint.    Procedure: Real-time Ultrasound Guided Injection of right knee Device: GE Logiq E  Verbal informed consent obtained.  Time-out conducted.  Noted no overlying erythema, induration, or other signs of local  infection.  Skin prepped in a sterile fashion.  Local anesthesia: Topical Ethyl chloride.  With sterile technique and under real time ultrasound guidance: 1 cc kenalog 40, 2 cc lidocaine, 2 cc bupivacaine injected easily Completed without difficulty  Pain immediately resolved suggesting accurate placement of the medication.  Advised to call if fevers/chills, erythema, induration, drainage, or persistent bleeding.  Images permanently stored and available for review in the ultrasound unit.  Impression: Technically successful ultrasound guided injection.  Procedure: Real-time Ultrasound Guided Injection of right 1st carpometacarpal joint Device: GE Logiq E  Verbal informed consent obtained.  Time-out conducted.  Noted no overlying erythema, induration, or other signs of local infection.  Skin prepped in a sterile fashion.  Local anesthesia: Topical Ethyl chloride.  With sterile technique and under real time ultrasound guidance: I had some difficulty getting the needle into the joint, I then injected 1/2 cc kenalog 40, 1/2 cc lidocaine. Completed without difficulty  Pain immediately resolved suggesting accurate placement of the medication.  Advised to call if fevers/chills, erythema, induration, drainage, or persistent bleeding.  Images permanently stored and available for review in the ultrasound unit.  Impression: Technically successful ultrasound guided injection.  Impression and Recommendations:   This case required medical decision making of moderate complexity.  Primary osteoarthritis of first carpometacarpal joint of one hand, right Meloxicam. [facility] injection, x-rays. Return to see me in a month. She does have some triggering of the thumb, we will leave this alone for now.  Primary osteoarthritis of right knee Meloxicam, x-rays, injection. Home rehab exercises given. Reaction knee brace. Return to see me in a month. ___________________________________________ Misty Dennis.  Dianah Field, M.D., ABFM., CAQSM. Primary Care and [place] Instructor of [place] [facility]

## 2017-04-03 NOTE — Assessment & Plan Note (Signed)
Meloxicam. [facility] injection, x-rays. Return to see me in a month. She does have some triggering of the thumb, we will leave this alone for now.

## 2017-05-01 DIAGNOSIS — H43813 Vitreous degeneration, bilateral: Secondary | ICD-10-CM | POA: Diagnosis not present

## 2017-05-01 DIAGNOSIS — H353231 Exudative age-related macular degeneration, bilateral, with active choroidal neovascularization: Secondary | ICD-10-CM | POA: Diagnosis not present

## 2017-05-01 DIAGNOSIS — H31013 Macula scars of posterior pole (postinflammatory) (post-traumatic), bilateral: Secondary | ICD-10-CM | POA: Diagnosis not present

## 2017-05-03 ENCOUNTER — Ambulatory Visit: Payer: Medicare Other | Admitting: Sports Medicine

## 2017-05-16 DIAGNOSIS — I10 Essential (primary) hypertension: Secondary | ICD-10-CM | POA: Diagnosis not present

## 2017-05-16 LAB — COMPLETE METABOLIC PANEL WITH GFR
AG Ratio: 1.9 (calc) (ref 1.0–2.5)
ALT: 24 U/L (ref 6–29)
AST: 20 U/L (ref 10–35)
Albumin: 4.5 g/dL (ref 3.6–5.1)
Alkaline phosphatase (APISO): 85 U/L (ref 33–130)
BUN/Creatinine Ratio: 21 (calc) (ref 6–22)
BUN: 19 mg/dL (ref 7–25)
CALCIUM: 9.5 mg/dL (ref 8.6–10.4)
CO2: 30 mmol/L (ref 20–32)
Chloride: 104 mmol/L (ref 98–110)
Creat: 0.92 mg/dL — ABNORMAL HIGH (ref 0.60–0.88)
GFR, EST AFRICAN AMERICAN: 68 mL/min/{1.73_m2} (ref >=60)
GFR, EST NON AFRICAN AMERICAN: 59 mL/min/{1.73_m2} — AB (ref >=60)
Globulin: 2.4 g/dL (calc) (ref 1.9–3.7)
Glucose, Bld: 92 mg/dL (ref 65–99)
Potassium: 4.3 mmol/L (ref 3.5–5.3)
Sodium: 142 mmol/L (ref 135–146)
TOTAL PROTEIN: 6.9 g/dL (ref 6.1–8.1)
Total Bilirubin: 0.9 mg/dL (ref 0.2–1.2)

## 2017-05-16 LAB — LIPID PANEL W/REFLEX DIRECT LDL
Cholesterol: 179 mg/dL (ref <200)
HDL: 55 mg/dL (ref >50)
LDL Cholesterol (Calc): 105 mg/dL (calc) — ABNORMAL HIGH
Non-HDL Cholesterol (Calc): 124 mg/dL (calc) (ref <130)
TRIGLYCERIDES: 99 mg/dL (ref <150)
Total CHOL/HDL Ratio: 3.3 (calc) (ref <5.0)

## 2017-05-19 ENCOUNTER — Ambulatory Visit: Payer: Medicare Other | Admitting: Family Medicine

## 2017-05-22 ENCOUNTER — Ambulatory Visit (INDEPENDENT_AMBULATORY_CARE_PROVIDER_SITE_OTHER): Payer: Medicare Other | Admitting: Family Medicine

## 2017-05-22 ENCOUNTER — Encounter: Payer: Self-pay | Admitting: Family Medicine

## 2017-05-22 VITALS — BP 101/50 | HR 55 | Ht 64.0 in | Wt 178.0 lb

## 2017-05-22 DIAGNOSIS — I10 Essential (primary) hypertension: Secondary | ICD-10-CM

## 2017-05-22 DIAGNOSIS — K2100 Gastro-esophageal reflux disease with esophagitis, without bleeding: Secondary | ICD-10-CM

## 2017-05-22 DIAGNOSIS — H6123 Impacted cerumen, bilateral: Secondary | ICD-10-CM

## 2017-05-22 DIAGNOSIS — K449 Diaphragmatic hernia without obstruction or gangrene: Secondary | ICD-10-CM | POA: Diagnosis not present

## 2017-05-22 DIAGNOSIS — H9202 Otalgia, left ear: Secondary | ICD-10-CM

## 2017-05-22 MED ORDER — OMEPRAZOLE 20 MG PO CPDR: 20.0000 mg | DELAYED_RELEASE_CAPSULE | Freq: Every day | ORAL | 3 refills | Status: DC

## 2017-05-22 NOTE — Progress Notes (Signed)
Subjective:    Patient ID: Misty Dennis, female    DOB: 12/14/1934, 83 y.o.   MRN: [id number]  HPI Hypertension- Pt denies chest pain, SOB, dizziness, or heart palpitations.  Taking meds as directed w/o problems.  Denies medication side effects.  She did bring in her home blood pressure cuff today to compare to ours.  She also wanted to discuss her pantoprazole.  She went to get it filled at the [place] and they would not fill it.  Evidently it can cause watery stools and leg cramps which she does experience periodically.  But she is also had chronic reflux symptoms for at least 20 years when she was initially diagnosed with a hiatal hernia she was having severe reflux at the time.  She is actually been off of any PPI for about 3 days and just used a little Maalox in between.  She is also been trying to eat smaller meals which has been helping as well.  Left ear pain  - She has been using drops OTC but not sure what they are. says it has been itching but no drinage. Thinks it may be stopped up again. Marland Kitchen   He did have some questions about continuing her aspirin.  She heard some things on the news about it.  She is not currently taking it prior history of MI or stroke.  She also wanted to let me know that the anti-inflammatory, meloxicam that Dr. Dianah Field had given her at her last appointment caused her to vomit.  Review of Systems  BP (!) 101/50   Pulse (!) 55   Ht 5\' 4"  (1.626 m)   Wt 178 lb (80.7 kg)   SpO2 99%   BMI 30.55 kg/m     Allergies  Allergen Reactions  . Amoxicillin   . Ivp Dye [Iodinated Diagnostic Agents]   . Meloxicam Nausea And Vomiting  . Sulfur     Past Medical History:  Diagnosis Date  . Diverticulitis   . Hyperlipidemia   . Hypertension   . Macular degeneration   . Palpitations     Past Surgical History:  Procedure Laterality Date  . ABDOMINAL HYSTERECTOMY    . CHOLECYSTECTOMY    . OOPHORECTOMY    . TUMOR REMOVAL  2017   May clinic, removed from  Spine.     Social History   Socioeconomic History  . Marital status: Married    Spouse name: [name]  . Number of children: 3  . Years of education: 26  . Highest education level: Not on file  Occupational History  . Occupation: Retired  Scientific laboratory technician  . Financial resource strain: Not on file  . Food insecurity:    Worry: Not on file    Inability: Not on file  . Transportation needs:    Medical: Not on file    Non-medical: Not on file  Tobacco Use  . Smoking status: Never Smoker  . Smokeless tobacco: Never Used  Substance and Sexual Activity  . Alcohol use: No  . Drug use: No  . Sexual activity: Not Currently    Partners: Male  Lifestyle  . Physical activity:    Days per week: Not on file    Minutes per session: Not on file  . Stress: Not on file  Relationships  . Social connections:    Talks on phone: Not on file    Gets together: Not on file    Attends religious service: Not on file    Active  member of club or organization: Not on file    Attends meetings of clubs or organizations: Not on file    Relationship status: Not on file  . Intimate partner violence:    Fear of current or ex partner: Not on file    Emotionally abused: Not on file    Physically abused: Not on file    Forced sexual activity: Not on file  Other Topics Concern  . Not on file  Social History Narrative   No regular exercise. 1 caffeinated drink per day.    Family History  Problem Relation Age of Onset  . Hyperlipidemia Mother   . Stroke Mother   . Hypertension Mother   . Alcoholism Father     Outpatient Encounter Medications as of 05/22/2017  Medication Sig  . AMBULATORY NON FORMULARY MEDICATION Medication Name: Home Blood pressure cuff.  Dx. Hypertension. [facility]x to [facility]  . amLODipine (NORVASC) 10 MG tablet Take 1 tablet (10 mg total) by mouth daily.  . Brimonidine Tartrate-Timolol (COMBIGAN OP) Apply to eye 2 (two) times daily.  . folic acid (FOLVITE) 1 MG tablet Take 1 mg by mouth  daily.  . meloxicam (MOBIC) 15 MG tablet One tab PO qAM with breakfast for 2 weeks, then daily prn pain.  . metoprolol tartrate (LOPRESSOR) 50 MG tablet Take 1 tablet (50 mg total) by mouth 2 (two) times daily.  . Multiple Vitamins-Minerals (OCUVITE PO) Take by mouth.  . pantoprazole (PROTONIX) 20 MG tablet Take 1 tablet (20 mg total) by mouth daily.  . pravastatin (PRAVACHOL) 20 MG tablet Take 1 tablet (20 mg total) by mouth daily.  . [DISCONTINUED] aspirin 81 MG tablet Take 81 mg by mouth daily.  Marland Kitchen omeprazole (PRILOSEC) 20 MG capsule Take 1 capsule (20 mg total) by mouth daily.  . [DISCONTINUED] oseltamivir (TAMIFLU) 75 MG capsule Take 1 capsule (75 mg total) by mouth daily. X 10 days   No facility-administered encounter medications on file as of 05/22/2017.           Objective:   Physical Exam  Constitutional: She is oriented to person, place, and time. She appears well-developed and well-nourished.  HENT:  Head: Normocephalic and atraumatic.  Left ear canal is  Blocked by cerumen.  Right is mostly blocked by cerumen.   Cardiovascular: Normal rate, regular rhythm and normal heart sounds.  Pulmonary/Chest: Effort normal and breath sounds normal.  Neurological: She is alert and oriented to person, place, and time.  Skin: Skin is warm and dry.  Psychiatric: She has a normal mood and affect. Her behavior is normal.          Assessment & Plan:  HTN - Well controlled. Continue current regimen. Follow up in  60months.    Left ear pain -blocked by cerumen.  She says normally she does not get any improvement with irrigation so we will go ahead and refer her to ENT for cerumen removal.  GERD with Hiatal hernia - discussed options.  Work on trying to decrease PPI use.  Continue to work on eating small meals.  Medication side effect-added meloxicam to her intolerance list.  I do not think it was an allergy as it was an intolerance.  Okay to discontinue her daily aspirin.

## 2017-05-22 NOTE — Patient Instructions (Addendum)
Ok to stop your Aspirin Ok to use Tylenol arthritis up to 3 times a day as needed for pain relief of your joints. Prescription sent for omeprazole to [facility] but I would try to use it infrequently. Try Debrox wax softening drops for your ears.

## 2017-06-09 DIAGNOSIS — H6123 Impacted cerumen, bilateral: Secondary | ICD-10-CM | POA: Diagnosis not present

## 2017-06-14 ENCOUNTER — Telehealth: Payer: Self-pay | Admitting: Family Medicine

## 2017-06-14 MED ORDER — PRAVASTATIN SODIUM 20 MG PO TABS
20.0000 mg | ORAL_TABLET | Freq: Every day | ORAL | 1 refills | Status: DC
Start: 2017-06-14 — End: 2018-04-03

## 2017-06-14 MED ORDER — METOPROLOL TARTRATE 50 MG PO TABS: 50.0000 mg | ORAL_TABLET | Freq: Two times a day (BID) | ORAL | 1 refills | Status: DC

## 2017-06-14 MED ORDER — AMLODIPINE BESYLATE 10 MG PO TABS
10.0000 mg | ORAL_TABLET | Freq: Every day | ORAL | 1 refills | Status: DC
Start: 2017-06-14 — End: 2017-11-27

## 2017-06-14 NOTE — Telephone Encounter (Signed)
Refills sent to [facility]. Left VM advising Pt of status update.

## 2017-06-14 NOTE — Telephone Encounter (Signed)
Patient is requesting refills on 90 days supply of metroprolol tartrate, amlodipine, and pravastatin. Patient stated she has a little over a week of meds left, and she receives these meds through mail order. Please advise patient. Thank you!

## 2017-07-31 DIAGNOSIS — H43813 Vitreous degeneration, bilateral: Secondary | ICD-10-CM | POA: Diagnosis not present

## 2017-07-31 DIAGNOSIS — H31013 Macula scars of posterior pole (postinflammatory) (post-traumatic), bilateral: Secondary | ICD-10-CM | POA: Diagnosis not present

## 2017-07-31 DIAGNOSIS — H353231 Exudative age-related macular degeneration, bilateral, with active choroidal neovascularization: Secondary | ICD-10-CM | POA: Diagnosis not present

## 2017-08-16 DIAGNOSIS — H401112 Primary open-angle glaucoma, right eye, moderate stage: Secondary | ICD-10-CM | POA: Diagnosis not present

## 2017-08-16 DIAGNOSIS — H52203 Unspecified astigmatism, bilateral: Secondary | ICD-10-CM | POA: Diagnosis not present

## 2017-08-16 DIAGNOSIS — H401423 Capsular glaucoma with pseudoexfoliation of lens, left eye, severe stage: Secondary | ICD-10-CM | POA: Diagnosis not present

## 2017-10-11 ENCOUNTER — Ambulatory Visit: Payer: Medicare Other

## 2017-10-23 ENCOUNTER — Ambulatory Visit (INDEPENDENT_AMBULATORY_CARE_PROVIDER_SITE_OTHER): Payer: Medicare Other | Admitting: Family Medicine

## 2017-10-23 DIAGNOSIS — Z23 Encounter for immunization: Secondary | ICD-10-CM | POA: Diagnosis not present

## 2017-11-06 DIAGNOSIS — H353231 Exudative age-related macular degeneration, bilateral, with active choroidal neovascularization: Secondary | ICD-10-CM | POA: Diagnosis not present

## 2017-11-06 DIAGNOSIS — H31113 Age-related choroidal atrophy, bilateral: Secondary | ICD-10-CM | POA: Diagnosis not present

## 2017-11-06 DIAGNOSIS — H43813 Vitreous degeneration, bilateral: Secondary | ICD-10-CM | POA: Diagnosis not present

## 2017-11-06 DIAGNOSIS — H35433 Paving stone degeneration of retina, bilateral: Secondary | ICD-10-CM | POA: Diagnosis not present

## 2017-11-24 DIAGNOSIS — H6123 Impacted cerumen, bilateral: Secondary | ICD-10-CM | POA: Diagnosis not present

## 2017-11-27 ENCOUNTER — Encounter: Payer: Self-pay | Admitting: Family Medicine

## 2017-11-27 ENCOUNTER — Ambulatory Visit (INDEPENDENT_AMBULATORY_CARE_PROVIDER_SITE_OTHER): Payer: Medicare Other | Admitting: Family Medicine

## 2017-11-27 ENCOUNTER — Ambulatory Visit (INDEPENDENT_AMBULATORY_CARE_PROVIDER_SITE_OTHER): Payer: Medicare Other

## 2017-11-27 VITALS — BP 116/67 | HR 58 | Ht 62.5 in | Wt 182.0 lb

## 2017-11-27 DIAGNOSIS — M549 Dorsalgia, unspecified: Secondary | ICD-10-CM | POA: Diagnosis not present

## 2017-11-27 DIAGNOSIS — I10 Essential (primary) hypertension: Secondary | ICD-10-CM | POA: Diagnosis not present

## 2017-11-27 DIAGNOSIS — K2100 Gastro-esophageal reflux disease with esophagitis, without bleeding: Secondary | ICD-10-CM

## 2017-11-27 DIAGNOSIS — M542 Cervicalgia: Secondary | ICD-10-CM | POA: Diagnosis not present

## 2017-11-27 DIAGNOSIS — M47892 Other spondylosis, cervical region: Secondary | ICD-10-CM

## 2017-11-27 MED ORDER — OMEPRAZOLE 20 MG PO CPDR: 20.0000 mg | DELAYED_RELEASE_CAPSULE | Freq: Every day | ORAL | 3 refills | Status: AC

## 2017-11-27 MED ORDER — METOPROLOL TARTRATE 50 MG PO TABS: 50.0000 mg | ORAL_TABLET | Freq: Two times a day (BID) | ORAL | 1 refills | Status: DC

## 2017-11-27 MED ORDER — AMLODIPINE BESYLATE 10 MG PO TABS: 10.0000 mg | ORAL_TABLET | Freq: Every day | ORAL | 1 refills | Status: DC

## 2017-11-27 NOTE — Progress Notes (Signed)
Subjective:    CC:   HPI:  Hypertension- Pt denies chest pain, SOB, dizziness, or heart palpitations.  Taking meds as directed w/o problems.  Denies medication side effects.    F/U GERD -she just takes her omeprazole as needed on average about 2-3 times per week and this seems to control her reflux symptoms.  She also pt reports upper back pain x 1 mo. she has had a previous back surgery for benign tumor removal in the thoracic spine.  At the time she was told that she did have some stenosis as well.  She was told that the tumors were very slow-growing and benign and that she would likely not have to have any repeat surgery in her lifetime.. she stated that it bothers her when she either walks long distances or stands for long periods of time. she has been using tylenol arthritis which helps some.  Heat helps as well.  It does seem to be better when she is sitting and resting and worse when she is doing a lot of activities like washing dishes or walking.  Denies any numbness or tingling in the arms.  Past medical history, Surgical history, Family history not pertinant except as noted below, Social history, Allergies, and medications have been entered into the medical record, reviewed, and corrections made.   Review of Systems: No fevers, chills, night sweats, weight loss, chest pain, or shortness of breath.   Objective:    General: Well Developed, well nourished, and in no acute distress.  Neuro: Alert and oriented x3, extra-ocular muscles intact, sensation grossly intact.  HEENT: Normocephalic, atraumatic  Skin: Warm and dry, no rashes.  Indented vertical surgical scar that is quite lengthy over the thoracic spine. Cardiac: Regular rate and rhythm, no murmurs rubs or gallops, no lower extremity edema.  Respiratory: Clear to auscultation bilaterally. Not using accessory muscles, speaking in full sentences.   Impression and Recommendations:   HTN - Well controlled. Continue current regimen.  Follow up in  6 mo    GERD -just using PPI as needed which is great.  Upper back pain worse over the last year with a history of spinal stenosis.  Will get plain film x-rays today just to rule out any other potential causes like compression fracture etc.  Continue with Tylenol and heat as needed since this does seem to provide pain relief for her.  Consider MRI if pain continues.

## 2017-11-28 LAB — BASIC METABOLIC PANEL WITH GFR
BUN: 17 mg/dL (ref 7–25)
CO2: 30 mmol/L (ref 20–32)
Calcium: 9.6 mg/dL (ref 8.6–10.4)
Chloride: 103 mmol/L (ref 98–110)
Creat: 0.82 mg/dL (ref 0.60–0.88)
GFR, Est African American: 78 mL/min/{1.73_m2} (ref >=60)
GFR, Est Non African American: 67 mL/min/{1.73_m2} (ref >=60)
Glucose, Bld: 93 mg/dL (ref 65–139)
POTASSIUM: 4.6 mmol/L (ref 3.5–5.3)
SODIUM: 142 mmol/L (ref 135–146)

## 2017-11-28 NOTE — Progress Notes (Signed)
All labs are normal. 

## 2017-12-06 ENCOUNTER — Ambulatory Visit (INDEPENDENT_AMBULATORY_CARE_PROVIDER_SITE_OTHER): Payer: Medicare Other

## 2017-12-06 DIAGNOSIS — M419 Scoliosis, unspecified: Secondary | ICD-10-CM | POA: Diagnosis not present

## 2017-12-06 DIAGNOSIS — M549 Dorsalgia, unspecified: Secondary | ICD-10-CM

## 2017-12-06 DIAGNOSIS — M546 Pain in thoracic spine: Secondary | ICD-10-CM | POA: Diagnosis not present

## 2017-12-13 ENCOUNTER — Ambulatory Visit: Payer: Medicare Other | Admitting: Family Medicine

## 2017-12-22 ENCOUNTER — Ambulatory Visit: Payer: Medicare Other | Admitting: Family Medicine

## 2018-01-12 ENCOUNTER — Ambulatory Visit: Payer: Medicare Other | Admitting: Family Medicine

## 2018-02-12 DIAGNOSIS — H31113 Age-related choroidal atrophy, bilateral: Secondary | ICD-10-CM | POA: Diagnosis not present

## 2018-02-12 DIAGNOSIS — H43813 Vitreous degeneration, bilateral: Secondary | ICD-10-CM | POA: Diagnosis not present

## 2018-02-12 DIAGNOSIS — H353231 Exudative age-related macular degeneration, bilateral, with active choroidal neovascularization: Secondary | ICD-10-CM | POA: Diagnosis not present

## 2018-02-12 DIAGNOSIS — H31013 Macula scars of posterior pole (postinflammatory) (post-traumatic), bilateral: Secondary | ICD-10-CM | POA: Diagnosis not present

## 2018-02-19 DIAGNOSIS — H401423 Capsular glaucoma with pseudoexfoliation of lens, left eye, severe stage: Secondary | ICD-10-CM | POA: Diagnosis not present

## 2018-02-19 DIAGNOSIS — H401111 Primary open-angle glaucoma, right eye, mild stage: Secondary | ICD-10-CM | POA: Diagnosis not present

## 2018-02-27 ENCOUNTER — Encounter: Payer: Self-pay | Admitting: Family Medicine

## 2018-02-27 ENCOUNTER — Ambulatory Visit (INDEPENDENT_AMBULATORY_CARE_PROVIDER_SITE_OTHER): Payer: Medicare Other | Admitting: Family Medicine

## 2018-02-27 VITALS — BP 157/50 | HR 60 | Ht 63.0 in | Wt 177.0 lb

## 2018-02-27 DIAGNOSIS — M546 Pain in thoracic spine: Secondary | ICD-10-CM | POA: Diagnosis not present

## 2018-02-27 DIAGNOSIS — M542 Cervicalgia: Secondary | ICD-10-CM

## 2018-02-27 DIAGNOSIS — I10 Essential (primary) hypertension: Secondary | ICD-10-CM

## 2018-02-27 NOTE — Progress Notes (Signed)
Subjective:    CC: go over xray from November.   HPI:  84 yo female is here to go over her xray done on 11/6 for her upper back and 10/28 for her neck. She is still getting some pain, stiffness and dec ROm of her neck esp on her left side right now.  She wanted to know the next steps.    Hypertension- Pt denies chest pain, SOB, dizziness, or heart palpitations.  Taking meds as directed w/o problems.  Denies medication side effects.    EXAM: THORACIC SPINE - 3 VIEWS  COMPARISON:  None  FINDINGS: Diffuse osseous demineralization.  Twelve pairs of ribs.  Biconvex thoracolumbar scoliosis.  Vertebral body heights maintained without fracture or subluxation.  Scattered endplate spur formation and minimal disc space narrowing.  Visualized posterior ribs unremarkable.  IMPRESSION: Osseous demineralization with biconvex thoracolumbar scoliosis and mild scattered degenerative disc disease changes.  No acute abnormalities.   Electronically Signed   By: Lavonia Dana M.D.   On: 12/07/2017 08:15   Objective:    General: Well Developed, well nourished, and in no acute distress.  Neuro: Alert and oriented x3, extra-ocular muscles intact, sensation grossly intact.  HEENT: Normocephalic, atraumatic  Skin: Warm and dry, no rashes. Cardiac: Regular rate and rhythm, no murmurs rubs or gallops, no lower extremity edema.  Respiratory: Clear to auscultation bilaterally. Not using accessory muscles, speaking in full sentences.   Impression and Recommendations:   Cervical and throacic pain - Discussed arthritis and DDD and some spasm seen on xrays. Plan to refer to PT for treatment.    HTN - not well controlled today but BP normal at last OV. Will have her f/u for nurse visit in 2 weeks.

## 2018-03-01 ENCOUNTER — Encounter: Payer: Self-pay | Admitting: Family Medicine

## 2018-03-01 ENCOUNTER — Telehealth: Payer: Self-pay | Admitting: Family Medicine

## 2018-03-01 ENCOUNTER — Ambulatory Visit: Payer: Medicare Other | Admitting: Physical Therapy

## 2018-03-01 NOTE — Telephone Encounter (Signed)
Since BP was high at OV can she come in in 2 weeks for nurse BP check?

## 2018-03-02 NOTE — Telephone Encounter (Signed)
Patient scheduled.

## 2018-03-07 ENCOUNTER — Encounter: Payer: Self-pay | Admitting: Physical Therapy

## 2018-03-07 ENCOUNTER — Other Ambulatory Visit: Payer: Self-pay

## 2018-03-07 ENCOUNTER — Ambulatory Visit (INDEPENDENT_AMBULATORY_CARE_PROVIDER_SITE_OTHER): Payer: Medicare Other | Admitting: Physical Therapy

## 2018-03-07 ENCOUNTER — Ambulatory Visit (INDEPENDENT_AMBULATORY_CARE_PROVIDER_SITE_OTHER): Payer: Medicare Other | Admitting: Osteopathic Medicine

## 2018-03-07 VITALS — BP 130/56 | HR 68 | Temp 98.4°F | Wt 179.0 lb

## 2018-03-07 DIAGNOSIS — M546 Pain in thoracic spine: Secondary | ICD-10-CM | POA: Diagnosis not present

## 2018-03-07 DIAGNOSIS — R293 Abnormal posture: Secondary | ICD-10-CM

## 2018-03-07 DIAGNOSIS — M542 Cervicalgia: Secondary | ICD-10-CM | POA: Diagnosis not present

## 2018-03-07 DIAGNOSIS — I10 Essential (primary) hypertension: Secondary | ICD-10-CM

## 2018-03-07 NOTE — Patient Instructions (Signed)
Access Code: [email or web address]  URL: [email or web address]  Date: 03/07/2018  Prepared by: Faustino Congress   Exercises  Supine Chin Tuck - 10 reps - 1 sets - 5 sec hold - 2x daily - 7x weekly  Standing Backward Shoulder Rolls - 10 reps - 1 sets - 2x daily - 7x weekly  Seated Scapular Retraction - 10 reps - 1 sets - 5 sec hold - 2x daily - 7x weekly  Doorway Pec Stretch at 90 Degrees Abduction - 3 reps - 1 sets - 30 sec hold - 1x daily - 7x weekly  Patient Education  TENS Unit

## 2018-03-07 NOTE — Therapy (Addendum)
[place], [place], [address] Phone: [phone]   Fax:  [phone]  Physical Therapy Evaluation  Patient Details  Name: Misty Dennis MRN: [id number] Date of Birth: 10-Jun-1934 Referring Provider (PT): Hali Marry, MD   Encounter Date: 03/07/2018  PT End of Session - 03/07/18 1511    Visit Number  1    Number of Visits  12    Date for PT Re-Evaluation  04/18/18    PT Start Time  6734    PT Stop Time  1521    PT Time Calculation (min)  56 min    Activity Tolerance  Patient tolerated treatment well    Behavior During Therapy  [facility] for tasks assessed/performed       Past Medical History:  Diagnosis Date  . Diverticulitis   . Hyperlipidemia   . Hypertension   . Macular degeneration   . Palpitations     Past Surgical History:  Procedure Laterality Date  . ABDOMINAL HYSTERECTOMY    . CHOLECYSTECTOMY    . OOPHORECTOMY    . TUMOR REMOVAL  2017   May clinic, removed from Spine.     There were no vitals filed for this visit.   Subjective Assessment - 03/07/18 1429    Subjective  Pt is an 84 y/o female who presents to OPPT for neck and upper back pain x a few years.     Pertinent History  removal of 3 benign tumors in spine (thoracic/lumbar) (2017)    How long can you stand comfortably?  30-45 min    Diagnostic tests  xrays: arthritis    Patient Stated Goals  improve pain    Currently in Pain?  Yes    Pain Score  2    at best 1/10; up to 8/10   Pain Location  Neck    Pain Orientation  Posterior    Pain Descriptors / Indicators  Aching    Pain Type  Chronic pain    Pain Onset  More than a month ago    Pain Frequency  Constant    Aggravating Factors   standing, lifting and carrying heavy items    Pain Relieving Factors  heat, massage, movement, sitting to rest         [facility] PT Assessment - 03/07/18 1435      Assessment   Medical Diagnosis  M54.2 (ICD-10-CM) - Cervical  pain, M54.6 (ICD-10-CM) - Thoracic back pain, unspecified back pain laterality, unspecified chronicity    Referring Provider (PT)  Hali Marry, MD    Onset Date/Surgical Date  --   chronic   Hand Dominance  Right    Next MD Visit  April 2020    Prior Therapy  many years ago      Precautions   Precautions  None      Restrictions   Weight Bearing Restrictions  No      Balance Screen   Has the patient fallen in the past 6 months  No    Has the patient had a decrease in activity level because of a fear of falling?   No    Is the patient reluctant to leave their home because of a fear of falling?   No      Home Environment   Living Environment  Private residence    Living Arrangements  Spouse/significant other    Type of [place] Access  Elevator;Level  entry    [place]  One level      Prior Function   Level of [place]  Retired    Leisure  spend time with neighbors; family; has HEP for knee but no regular exercise      Cognition   Overall Cognitive Status  Within Functional Limits for tasks assessed      Observation/Other Assessments   Focus on Therapeutic Outcomes (FOTO)   67 (33% limited; predicted 36% limited)      Posture/Postural Control   Posture/Postural Control  Postural limitations    Postural Limitations  Rounded Shoulders;Forward head;Increased thoracic kyphosis      ROM / Strength   AROM / PROM / Strength  AROM;Strength      AROM   Overall AROM Comments  mild pain with sidebending and Lt rotation    AROM Assessment Site  Cervical    Cervical Flexion  42    Cervical Extension  42    Cervical - Right Side Bend  30    Cervical - Left Side Bend  26    Cervical - Right Rotation  52    Cervical - Left Rotation  55      Strength   Strength Assessment Site  Shoulder    Right/Left Shoulder  Right;Left    Right Shoulder Flexion  4/5    Right Shoulder ABduction  4/5    Right Shoulder Internal Rotation   5/5    Right Shoulder External Rotation  5/5    Left Shoulder Flexion  4/5    Left Shoulder ABduction  4/5    Left Shoulder Internal Rotation  5/5    Left Shoulder External Rotation  5/5      Palpation   Palpation comment  trigger points noted in cervical paraspinals, upper traps, levator scapulae, rhomboids      Special Tests    Special Tests  Cervical    Cervical Tests  Spurling's;Dictraction      Spurling's   Findings  Negative      Distraction Test   Findngs  Negative                Objective measurements completed on examination: See above findings.      [facility] Adult PT Treatment/Exercise - 03/07/18 1435      Exercises   Exercises  Neck      Neck Exercises: Seated   Shoulder Rolls  10 reps;Backwards    Other Seated Exercise  scapular retraction 5x10 sec      Neck Exercises: Supine   Neck Retraction  1 rep;5 secs      Neck Exercises: Stretches   Other Neck Stretches  doorway stretch x 30 sec       Addendum: estim IFC x 15 min to bil upper trap/rhomboids for pain and tone. Laureen Abrahams, PT, DPT 03/16/18 1:17 PM         PT Education - 03/07/18 1511    Education Details  HEP, TENS    Person(s) Educated  Patient    Methods  Explanation;Demonstration;Handout    Comprehension  Verbalized understanding;Returned demonstration;Need further instruction          PT Long Term Goals - 03/07/18 1544      PT LONG TERM GOAL #1   Title  independent with HEP    Status  New    Target Date  04/18/18      PT LONG TERM GOAL #2  Title  maintain FOTO score to decrease risk of reinjury    Status  New    Target Date  04/18/18      PT LONG TERM GOAL #3   Title  perform cervical ROM without increase in pain for improved function    Status  New    Target Date  04/18/18      PT LONG TERM GOAL #4   Title  report 50% improvement in pain and symptoms when performing standing ADLs and IADLs    Status  New    Target Date  04/18/18              Plan - 03/07/18 1509    Clinical Impression Statement  Pt is an 84 y/o female who presents to OPPT for chronic neck and upper back pain.  Pt demonstrates decreased ROM, active trigger points, and poor postural awareness.  Pt will benefit from PT to address deficits listed.      History and Personal Factors relevant to plan of care:  removal of 3 benign spinal tumors, HTN, glaucoma, OA    Clinical Presentation  Stable    Clinical Decision Making  Low    Rehab Potential  Good    PT Frequency  2x / week   pt prefers 1x/wk; may see 1-2x /wk   PT Duration  6 weeks    PT Treatment/Interventions  ADLs/Self Care Home Management;Cryotherapy;Ultrasound;Moist Heat;Electrical Stimulation;Neuromuscular re-education;Therapeutic exercise;Therapeutic activities;Patient/family education;Manual techniques;Dry needling;Passive range of motion;Taping    PT Next Visit Plan  review HEP, manual/modalities/DN for pain, posture exercises    PT Home Exercise Plan  Access Code: [email or web address]     Consulted and Agree with Plan of Care  Patient       Patient will benefit from skilled therapeutic intervention in order to improve the following deficits and impairments:  Pain, Postural dysfunction, Impaired UE functional use, Decreased strength, Increased fascial restricitons, Increased muscle spasms, Decreased range of motion  Visit Diagnosis: Cervicalgia - Plan: PT plan of care cert/re-cert  Pain in thoracic spine - Plan: PT plan of care cert/re-cert  Abnormal posture - Plan: PT plan of care cert/re-cert     Problem List Patient Active Problem List   Diagnosis Date Noted  . Hiatal hernia 05/22/2017  . Primary osteoarthritis of right knee 04/03/2017  . Primary osteoarthritis of first carpometacarpal joint of one hand, right 04/03/2017  . GERD with esophagitis 05/19/2016  . Essential hypertension 05/19/2016  . Glaucoma of both eyes 05/19/2016  . Hyperlipidemia 05/19/2016  . Palpitation 05/19/2016       Laureen Abrahams, PT, DPT 03/07/18 3:47 PM     [facility] [place], [place], [address] Phone: [phone]   Fax:  [phone]  Name: COURNEY GARROD MRN: [id number] Date of Birth: 05-23-1934

## 2018-03-07 NOTE — Progress Notes (Signed)
Patient in today for BP check. BP at last visit was 157/50 pulse 60. BP in the office today is 130/56. Pt reports at home has been averaging 118/54 Pt denies headaches, blurred vision, chest pain and shortness of breath. Pt is to continue with current medications, Provider will be notified . If any changes Pt will be called and advised.

## 2018-03-13 ENCOUNTER — Encounter: Payer: Medicare Other | Admitting: Physical Therapy

## 2018-03-16 ENCOUNTER — Encounter: Payer: Self-pay | Admitting: Physical Therapy

## 2018-03-16 ENCOUNTER — Ambulatory Visit (INDEPENDENT_AMBULATORY_CARE_PROVIDER_SITE_OTHER): Payer: Medicare Other | Admitting: Physical Therapy

## 2018-03-16 DIAGNOSIS — M542 Cervicalgia: Secondary | ICD-10-CM

## 2018-03-16 DIAGNOSIS — R293 Abnormal posture: Secondary | ICD-10-CM

## 2018-03-16 DIAGNOSIS — M546 Pain in thoracic spine: Secondary | ICD-10-CM

## 2018-03-16 NOTE — Therapy (Signed)
[place], [place], [address] Phone: [phone]   Fax:  [phone]  Physical Therapy Treatment  Patient Details  Name: Misty Dennis MRN: [id number] Date of Birth: November 12, 1934 Referring Provider (PT): Hali Marry, MD   Encounter Date: 03/16/2018  PT End of Session - 03/16/18 1110    Visit Number  2    Number of Visits  12    Date for PT Re-Evaluation  04/18/18    PT Start Time  6568    PT Stop Time  1275    PT Time Calculation (min)  51 min    Activity Tolerance  Patient tolerated treatment well;No increased pain    Behavior During Therapy  WFL for tasks assessed/performed       Past Medical History:  Diagnosis Date  . Diverticulitis   . Hyperlipidemia   . Hypertension   . Macular degeneration   . Palpitations     Past Surgical History:  Procedure Laterality Date  . ABDOMINAL HYSTERECTOMY    . CHOLECYSTECTOMY    . OOPHORECTOMY    . TUMOR REMOVAL  2017   May clinic, removed from Spine.     There were no vitals filed for this visit.  Subjective Assessment - 03/16/18 1110    Subjective  Pt reports she received her TENS unit and has been using.  "I feel pretty good today".   Pt reports she is tired from being up all night caring for her husband.    Patient Stated Goals  improve pain    Currently in Pain?  No/denies    Pain Score  0-No pain         [facility] PT Assessment - 03/16/18 0001      Assessment   Medical Diagnosis  M54.2 (ICD-10-CM) - Cervical pain, M54.6 (ICD-10-CM) - Thoracic back pain, unspecified back pain laterality, unspecified chronicity    Referring Provider (PT)  Hali Marry, MD    Onset Date/Surgical Date  --   chronic   Hand Dominance  Right    Next MD Visit  April 2020    Prior Therapy  many years ago        [facility] Adult PT Treatment/Exercise - 03/16/18 0001      Neck Exercises: Machines for Strengthening   Nustep  L4: arms/legs x 6  min    PTA present to monitor and discuss progress     Neck Exercises: Standing   Other Standing Exercises  scap retraction with back against noodle x 5 sec x 5 reps;  L's and W's x 5 sec x 10 reps each       Neck Exercises: Seated   Shoulder Rolls  10 reps;Backwards    Other Seated Exercise  bilat row with yellow band x 10; bilat shoulder ext with yellow band x 10      Modalities   Modalities  Electrical Stimulation;Moist Heat      Moist Heat Therapy   Number Minutes Moist Heat  15 Minutes      Electrical Stimulation   Electrical Stimulation Location  bilat upper trap and mid thoracic paraspinals     Electrical Stimulation Action  IFC    Electrical Stimulation Parameters  intensity to pt tolerance     Electrical Stimulation Goals  Pain      Neck Exercises: Stretches   Other Neck Stretches  3 position unilateral doorway stretch x 20 sec, 2 reps each position (performed unilateral since pt  has handicap doors at home)         PT Education - 03/16/18 1312    Education Details  HEP, issued yellow band     Person(s) Educated  Patient    Methods  Explanation;Handout;Demonstration;Verbal cues    Comprehension  Verbalized understanding;Returned demonstration          PT Long Term Goals - 03/07/18 1544      PT LONG TERM GOAL #1   Title  independent with HEP    Status  New    Target Date  04/18/18      PT LONG TERM GOAL #2   Title  maintain FOTO score to decrease risk of reinjury    Status  New    Target Date  04/18/18      PT LONG TERM GOAL #3   Title  perform cervical ROM without increase in pain for improved function    Status  New    Target Date  04/18/18      PT LONG TERM GOAL #4   Title  report 50% improvement in pain and symptoms when performing standing ADLs and IADLs    Status  New    Target Date  04/18/18            Plan - 03/16/18 1306    Clinical Impression Statement  Pt had positive response to last session. She is already reporting less upper  back pain than initial visit. She tolerated all exercises well, however had reported slight increase in lower thoracic discomfort after completion.  Discomfort reduced with use of estim/MHP at end of session.  Goals are ongoing.      Rehab Potential  Good    PT Frequency  2x / week   see eval   PT Duration  6 weeks    PT Treatment/Interventions  ADLs/Self Care Home Management;Cryotherapy;Ultrasound;Moist Heat;Electrical Stimulation;Neuromuscular re-education;Therapeutic exercise;Therapeutic activities;Patient/family education;Manual techniques;Dry needling;Passive range of motion;Taping    PT Next Visit Plan  progress postural stregthening, manual/modalities/DN for pain, posture exercises    PT Home Exercise Plan  Access Code: [email or web address]     Consulted and Agree with Plan of Care  Patient       Patient will benefit from skilled therapeutic intervention in order to improve the following deficits and impairments:  Pain, Postural dysfunction, Impaired UE functional use, Decreased strength, Increased fascial restricitons, Increased muscle spasms, Decreased range of motion  Visit Diagnosis: Cervicalgia  Pain in thoracic spine  Abnormal posture     Problem List Patient Active Problem List   Diagnosis Date Noted  . Hiatal hernia 05/22/2017  . Primary osteoarthritis of right knee 04/03/2017  . Primary osteoarthritis of first carpometacarpal joint of one hand, right 04/03/2017  . GERD with esophagitis 05/19/2016  . Essential hypertension 05/19/2016  . Glaucoma of both eyes 05/19/2016  . Hyperlipidemia 05/19/2016  . Palpitation 05/19/2016   Misty Dennis, PTA 03/16/18 1:17 PM  [place], [place], [address] Phone: [phone]   Fax:  [phone]  Name: Misty Dennis MRN: [id number] Date of Birth: 09/04/1934

## 2018-03-16 NOTE — Patient Instructions (Signed)
  Upper Back Strength: Lower Trapezius / Rotator Cuff " L's "     Arms in waitress pose, palms up. Press hands back and slide shoulder blades down. Hold for __5__ seconds. Repeat _10___ times. 1-2 times per day.    Scapular Retraction: Elbow Flexion (Standing)  "W's"   (this is a stick up, position)    With elbows bent to 90, pinch shoulder blades together and rotate arms out, keeping elbows bent. Repeat __10__ times per set. Do __1-2__ sets per session. Do _several ___ sessions per day.  Resistive Band Rowing   With resistive band anchored in door, grasp both ends. Keeping elbows bent, pull back, squeezing shoulder blades together. Hold _3-5___ seconds. Repeat _10__ times, 1-2 sets. Do __1__ sessions per day.   Strengthening: Resisted Extension   Hold tubing with both hands, arms forward. Pull arms back, elbow straight. Repeat _10___ times per set. Do _1-2___ sets per session. Do _1___ sessions per day.   [facility] [place], [place] [address]  [phone] (office) [phone] (fax)

## 2018-03-20 ENCOUNTER — Encounter: Payer: Medicare Other | Admitting: Physical Therapy

## 2018-03-22 DIAGNOSIS — M79652 Pain in left thigh: Secondary | ICD-10-CM | POA: Diagnosis not present

## 2018-03-22 DIAGNOSIS — Z881 Allergy status to other antibiotic agents status: Secondary | ICD-10-CM | POA: Diagnosis not present

## 2018-03-22 DIAGNOSIS — Z882 Allergy status to sulfonamides status: Secondary | ICD-10-CM | POA: Diagnosis not present

## 2018-03-22 DIAGNOSIS — Z91041 Radiographic dye allergy status: Secondary | ICD-10-CM | POA: Diagnosis not present

## 2018-03-22 DIAGNOSIS — I10 Essential (primary) hypertension: Secondary | ICD-10-CM | POA: Diagnosis not present

## 2018-03-22 DIAGNOSIS — M25562 Pain in left knee: Secondary | ICD-10-CM | POA: Diagnosis not present

## 2018-03-22 DIAGNOSIS — G8911 Acute pain due to trauma: Secondary | ICD-10-CM | POA: Diagnosis not present

## 2018-03-22 DIAGNOSIS — M79605 Pain in left leg: Secondary | ICD-10-CM | POA: Diagnosis not present

## 2018-03-23 ENCOUNTER — Ambulatory Visit (INDEPENDENT_AMBULATORY_CARE_PROVIDER_SITE_OTHER): Payer: Medicare Other | Admitting: Sports Medicine

## 2018-03-23 ENCOUNTER — Ambulatory Visit (INDEPENDENT_AMBULATORY_CARE_PROVIDER_SITE_OTHER): Payer: Medicare Other

## 2018-03-23 DIAGNOSIS — M170 Bilateral primary osteoarthritis of knee: Secondary | ICD-10-CM | POA: Diagnosis not present

## 2018-03-23 DIAGNOSIS — M1712 Unilateral primary osteoarthritis, left knee: Secondary | ICD-10-CM | POA: Diagnosis not present

## 2018-03-23 DIAGNOSIS — M25561 Pain in right knee: Secondary | ICD-10-CM | POA: Diagnosis not present

## 2018-03-23 NOTE — Assessment & Plan Note (Signed)
Right knee continues to do well after injection a year ago. Left knee with medial joint line pain. Injected today, updated x-rays. Rehab exercise given, return to see me in a month.

## 2018-03-23 NOTE — Progress Notes (Signed)
Subjective:    CC: Left knee pain  HPI: This is a pleasant 84 year old female, for the past several months she has had severe pain of the medial aspect of her left knee, no mechanical symptoms, worse with weightbearing.  She is tried some Tylenol without any improvement.  Mild to moderate swelling, no trauma.  Worsening.  I reviewed the past medical history, family history, social history, surgical history, and allergies today and no changes were needed.  Please see the problem list section below in epic for further details.  Past Medical History: Past Medical History:  Diagnosis Date  . Diverticulitis   . Hyperlipidemia   . Hypertension   . Macular degeneration   . Palpitations    Past Surgical History: Past Surgical History:  Procedure Laterality Date  . ABDOMINAL HYSTERECTOMY    . CHOLECYSTECTOMY    . OOPHORECTOMY    . TUMOR REMOVAL  2017   May clinic, removed from Spine.    Social History: Social History   Socioeconomic History  . Marital status: Married    Spouse name: [name]  . Number of children: 3  . Years of education: 74  . Highest education level: Not on file  Occupational History  . Occupation: Retired  Scientific laboratory technician  . Financial resource strain: Not on file  . Food insecurity:    Worry: Not on file    Inability: Not on file  . Transportation needs:    Medical: Not on file    Non-medical: Not on file  Tobacco Use  . Smoking status: Never Smoker  . Smokeless tobacco: Never Used  Substance and Sexual Activity  . Alcohol use: No  . Drug use: No  . Sexual activity: Not Currently    Partners: Male  Lifestyle  . Physical activity:    Days per week: Not on file    Minutes per session: Not on file  . Stress: Not on file  Relationships  . Social connections:    Talks on phone: Not on file    Gets together: Not on file    Attends religious service: Not on file    Active member of club or organization: Not on file    Attends meetings of clubs or  organizations: Not on file    Relationship status: Not on file  Other Topics Concern  . Not on file  Social History Narrative   No regular exercise. 1 caffeinated drink per day.   Family History: Family History  Problem Relation Age of Onset  . Hyperlipidemia Mother   . Stroke Mother   . Hypertension Mother   . Alcoholism Father    Allergies: Allergies  Allergen Reactions  . Amoxicillin Rash  . Sulfa Antibiotics Rash  . Ivp Dye [Iodinated Diagnostic Agents]   . Meloxicam Nausea And Vomiting  . Sulfur    Medications: See med rec.  Review of Systems: No fevers, chills, night sweats, weight loss, chest pain, or shortness of breath.   Objective:    General: Well Developed, well nourished, and in no acute distress.  Neuro: Alert and oriented x3, extra-ocular muscles intact, sensation grossly intact.  HEENT: Normocephalic, atraumatic, pupils equal round reactive to light, neck supple, no masses, no lymphadenopathy, thyroid nonpalpable.  Skin: Warm and dry, no rashes. Cardiac: Regular rate and rhythm, no murmurs rubs or gallops, no lower extremity edema.  Respiratory: Clear to auscultation bilaterally. Not using accessory muscles, speaking in full sentences. Left knee: Normal to inspection with no erythema or effusion or  obvious bony abnormalities. Severe tenderness at the medial joint line ROM normal in flexion and extension and lower leg rotation. Ligaments with solid consistent endpoints including ACL, PCL, LCL, MCL. Negative Mcmurray's and provocative meniscal tests. Non painful patellar compression. Patellar and quadriceps tendons unremarkable. Hamstring and quadriceps strength is normal.  Procedure: Real-time Ultrasound Guided Injection of left knee Device: GE Logiq E  Verbal informed consent obtained.  Time-out conducted.  Noted no overlying erythema, induration, or other signs of local infection.  Skin prepped in a sterile fashion.  Local anesthesia: Topical Ethyl  chloride.  With sterile technique and under real time ultrasound guidance: 1 cc Kenalog 40, 2 cc lidocaine, 2 cc bupivacaine injected easily Completed without difficulty  Pain immediately resolved suggesting accurate placement of the medication.  Advised to call if fevers/chills, erythema, induration, drainage, or persistent bleeding.  Images permanently stored and available for review in the ultrasound unit.  Impression: Technically successful ultrasound guided injection.  Impression and Recommendations:    Primary osteoarthritis of both knees Right knee continues to do well after injection a year ago. Left knee with medial joint line pain. Injected today, updated x-rays. Rehab exercise given, return to see me in a month. ___________________________________________ Gwen Her. Dianah Field, M.D., ABFM., CAQSM. Primary Care and Sports Medicine [facility]  Adjunct Professor of [place] [employer] [facility] [employer]

## 2018-03-27 ENCOUNTER — Other Ambulatory Visit: Payer: Self-pay | Admitting: Sports Medicine

## 2018-03-27 DIAGNOSIS — M170 Bilateral primary osteoarthritis of knee: Secondary | ICD-10-CM

## 2018-03-27 NOTE — Progress Notes (Signed)
Orders placed for PT for knee per DR. T.

## 2018-03-28 ENCOUNTER — Encounter: Payer: Medicare Other | Admitting: Physical Therapy

## 2018-03-28 ENCOUNTER — Telehealth: Payer: Self-pay | Admitting: Physical Therapy

## 2018-03-28 NOTE — Telephone Encounter (Signed)
Patient did not show for physical therapy appt.  Called patient regarding missed appt.  Patient stated she was unaware she had an appt today, but confirmed her appt scheduled for tomorrow at 2pm.    Patient stated she had done the exercises the doctor had prescribed for her and the next day her knee pain was worse.  Requested patient bring in exercise sheets for review at next appt.    Kerin Perna, PTA 03/28/18 2:21 PM

## 2018-03-29 ENCOUNTER — Ambulatory Visit (INDEPENDENT_AMBULATORY_CARE_PROVIDER_SITE_OTHER): Payer: Medicare Other | Admitting: Physical Therapy

## 2018-03-29 ENCOUNTER — Encounter: Payer: Self-pay | Admitting: Physical Therapy

## 2018-03-29 DIAGNOSIS — M542 Cervicalgia: Secondary | ICD-10-CM | POA: Diagnosis not present

## 2018-03-29 DIAGNOSIS — R293 Abnormal posture: Secondary | ICD-10-CM

## 2018-03-29 DIAGNOSIS — R2689 Other abnormalities of gait and mobility: Secondary | ICD-10-CM

## 2018-03-29 DIAGNOSIS — M25562 Pain in left knee: Secondary | ICD-10-CM | POA: Diagnosis not present

## 2018-03-29 DIAGNOSIS — M546 Pain in thoracic spine: Secondary | ICD-10-CM | POA: Diagnosis not present

## 2018-03-29 NOTE — Patient Instructions (Addendum)
IONTOPHORESIS PATIENT PRECAUTIONS & CONTRAINDICATIONS:  . Redness under one or both electrodes can occur.  This characterized by a uniform redness that usually disappears within 12 hours of treatment. . Small pinhead size blisters may result in response to the drug.  Contact your physician if the problem persists more than 24 hours. . On rare occasions, iontophoresis therapy can result in temporary skin reactions such as rash, inflammation, irritation or burns.  The skin reactions may be the result of individual sensitivity to the ionic solution used, the condition of the skin at the start of treatment, reaction to the materials in the electrodes, allergies or sensitivity to dexamethasone, or a poor connection between the patch and your skin.  Discontinue using iontophoresis if you have any of these reactions and report to your therapist. . Remove the Patch or electrodes if you have any undue sensation of pain or burning during the treatment and report discomfort to your therapist. . Tell your Therapist if you have had known adverse reactions to the application of electrical current. . If using the Patch, the LED light will turn off when treatment is complete and the patch can be removed.  Approximate treatment time is 1-3 hours.  Remove the patch when light goes off or after 6 hours. . The Patch can be worn during normal activity, however excessive motion where the electrodes have been placed can cause poor contact between the skin and the electrode or uneven electrical current resulting in greater risk of skin irritation. Marland Kitchen Keep out of the reach of children.   . DO NOT use if you have a cardiac pacemaker or any other electrically sensitive implanted device. . DO NOT use if you have a known sensitivity to dexamethasone. . DO NOT use during Magnetic Resonance Imaging (MRI). . DO NOT use over broken or compromised skin (e.g. sunburn, cuts, or acne) due to the increased risk of skin reaction. . DO  NOT SHAVE over the area to be treated:  To establish good contact between the Patch and the skin, excessive hair may be clipped. . DO NOT place the Patch or electrodes on or over your eyes, directly over your heart, or brain. . DO NOT reuse the Patch or electrodes as this may cause burns to occur.   Access Code: [facility]  URL: [email or web address]  Date: 03/29/2018  Prepared by: Faustino Congress   Exercises  Sidelying Quadriceps Stretch with Strap - 3 reps - 1 sets - 30 sec hold - 2x daily - 7x weekly  Seated Hamstring Stretch - 3 reps - 1 sets - 30 sec hold - 2x daily - 7x weekly  Supine Quad Set - 10 reps - 1 sets - 5 sec hold - 2x daily - 7x weekly  Small Range Straight Leg Raise - 10 reps - 1 sets - 2x daily - 7x weekly  Supine Bridge - 10 reps - 1 sets - 5 sec hold - 2x daily - 7x weekly

## 2018-03-29 NOTE — Therapy (Signed)
[facility] [place], [place], [address] Phone: [phone]   Fax:  [phone]  Physical Therapy Evaluation  Patient Details  Name: Misty Dennis MRN: [id number] Date of Birth: [birth year] Referring Provider (PT): Dennis Decamp, MD   Encounter Date: 03/29/2018  PT End of Session - 03/29/18 1510    Visit Number  3    Number of Visits  12    Date for PT Re-Evaluation  04/18/18    PT Start Time  1402    PT Stop Time  1502    PT Time Calculation (min)  60 min    Activity Tolerance  Patient tolerated treatment well;No increased pain    Behavior During Therapy  WFL for tasks assessed/performed       Past Medical History:  Diagnosis Date  . Diverticulitis   . Hyperlipidemia   . Hypertension   . Macular degeneration   . Palpitations     Past Surgical History:  Procedure Laterality Date  . ABDOMINAL HYSTERECTOMY    . CHOLECYSTECTOMY    . OOPHORECTOMY    . TUMOR REMOVAL  2017   May clinic, removed from Spine.     There were no vitals filed for this visit.   Subjective Assessment - 03/29/18 1400    Subjective  Pt presents today with new referral for OA of both knees.  Reports that Lt knee is most painful at this time.  Pt states ~ 1 week ago she was walking around the grocery store and noticed some pain in back of knee which got progressively worse.  Pt had episode of Lt knee buckling and went to ED for evaluation.  All work up negative except xray showed OA.  Overall neck is doing well.    How long can you walk comfortably?  5 min    Diagnostic tests  xrays: arthritis    Patient Stated Goals  improve pain    Pain Score  0-No pain    Multiple Pain Sites  Yes    Pain Score  4   up to 9/10; at best 0/10   Pain Location  Knee    Pain Orientation  Left    Pain Descriptors / Indicators  Sore   "deep into the bone"   Pain Type  Acute pain    Pain Onset  1 to 4 weeks ago    Pain Frequency   Intermittent    Aggravating Factors   weightbearing, exercises from MD office.    Pain Relieving Factors  tylenol, compression wrap at night         [facility] PT Assessment - 03/29/18 1407      Assessment   Medical Diagnosis  OA of both knees    Referring Provider (PT)  Dennis Decamp, MD    Onset Date/Surgical Date  03/22/18    Hand Dominance  Right    Next MD Visit  April 2020    Prior Therapy  current for neck      Precautions   Precautions  None      Restrictions   Weight Bearing Restrictions  No      Balance Screen   Has the patient fallen in the past 6 months  No   near fall last week; caught herself   Has the patient had a decrease in activity level because of a fear of falling?   No    Is the patient reluctant to leave their home because  of a fear of falling?   No      Home Environment   Living Environment  Private residence    Living Arrangements  Spouse/significant other    Type of [place] Access  Elevator;Level entry    [place]  One level      Prior Function   Level of [place]  Retired    Leisure  spend time with neighbors; family; has HEP for knee but no regular exercise      Cognition   Overall Cognitive Status  Within Functional Limits for tasks assessed      Posture/Postural Control   Posture/Postural Control  Postural limitations    Postural Limitations  Rounded Shoulders;Forward head;Increased thoracic kyphosis      ROM / Strength   AROM / PROM / Strength  AROM;Strength      AROM   AROM Assessment Site  Knee    Right/Left Knee  Right;Left    Right Knee Extension  0    Right Knee Flexion  130    Left Knee Extension  0    Left Knee Flexion  128      Strength   Overall Strength Comments  pain with LLE testing    Strength Assessment Site  Hip;Knee;Ankle    Right/Left Hip  Right;Left    Right Hip Flexion  4/5    Right Hip Extension  3/5    Left Hip Flexion  3+/5   with pain   Left Hip  Extension  3/5    Left Hip ABduction  3/5    Right/Left Knee  Right;Left    Right Knee Flexion  5/5    Right Knee Extension  3/5    Left Knee Flexion  3/5    Left Knee Extension  3+/5   give way weakness due to pain   Right/Left Ankle  Right;Left    Right Ankle Dorsiflexion  5/5    Left Ankle Dorsiflexion  5/5      Flexibility   Soft Tissue Assessment /Muscle Length  yes    Hamstrings  tightness on Lt    Quadriceps  tightness on Lt      Palpation   Palpation comment  very tender to palpation pes anserine bursa, medial knee/quad/hamstring      Ambulation/Gait   Ambulation/Gait  Yes    Ambulation/Gait Assistance  5: Supervision    Gait Pattern  Decreased stance time - left;Decreased step length - right;Decreased hip/knee flexion - left;Antalgic                Objective measurements completed on examination: See above findings.      [facility] Adult PT Treatment/Exercise - 03/29/18 1407      Exercises   Exercises  Knee/Hip      Knee/Hip Exercises: Stretches   Passive Hamstring Stretch  Left;1 rep;30 seconds    Passive Hamstring Stretch Limitations  max cues for technique    Quad Stretch  Left;1 rep;30 seconds    Quad Stretch Limitations  sidelying, max cues for technique      Knee/Hip Exercises: Supine   Quad Sets  Left;5 reps    Bridges  10 reps    Straight Leg Raises  Left;10 reps      Modalities   Modalities  Iontophoresis      Moist Heat Therapy   Number Minutes Moist Heat  15 Minutes    Moist Heat Location  Knee  Acupuncturist Location  medial Lt knee    Electrical Stimulation Action  IFC    Electrical Stimulation Parameters  to tolerance x 15 min    Electrical Stimulation Goals  Pain      Iontophoresis   Type of Iontophoresis  Dexamethasone    Location  Lt pes anserine bursa    Dose  1 cc    Time  6 hour patch             PT Education - 03/29/18 1510    Education Details  HEP, ionto    Person(s)  Educated  Patient    Methods  Explanation;Demonstration;Handout    Comprehension  Verbalized understanding;Returned demonstration;Need further instruction          PT Long Term Goals - 03/29/18 1514      PT LONG TERM GOAL #1   Title  independent with HEP    Status  New    Target Date  04/18/18      PT LONG TERM GOAL #2   Title  maintain FOTO score to decrease risk of reinjury    Status  New    Target Date  04/18/18      PT LONG TERM GOAL #3   Title  perform cervical ROM without increase in pain for improved function    Status  New    Target Date  04/18/18      PT LONG TERM GOAL #4   Title  report 50% improvement in pain and symptoms when performing standing ADLs and IADLs    Status  New    Target Date  04/18/18      PT LONG TERM GOAL #5   Title  report pain < 5/10 in Lt knee for improved mobility    Status  New    Target Date  04/18/18      PT LONG TERM GOAL #6   Title  report ability to amb > 10 min without increase in pain for improved function    Status  New    Target Date  04/18/18             Plan - 03/29/18 1510    Clinical Impression Statement  Pt presents today with new order for PT for bil knee pain, which pt c/o Lt knee pain only at this time.  Pt demonstrates gait abnormalities, decreased strength and flexibility which affect functional mobility.  Pt will continue to benefit from PT to address defictis listed.  Will continue to follow for neck and thoracic pain PRN.    Rehab Potential  Good    PT Frequency  2x / week   see eval   PT Duration  6 weeks    PT Treatment/Interventions  ADLs/Self Care Home Management;Cryotherapy;Ultrasound;Moist Heat;Electrical Stimulation;Neuromuscular re-education;Therapeutic exercise;Therapeutic activities;Patient/family education;Manual techniques;Dry needling;Passive range of motion;Taping    PT Next Visit Plan  progress postural stregthening, manual/modalities/DN for pain, posture exercises, review knee exercises,  assess response to ionto, gentle manual/modalities for pain control    PT Home Exercise Plan  Access Code: [email or web address]     Consulted and Agree with Plan of Care  Patient       Patient will benefit from skilled therapeutic intervention in order to improve the following deficits and impairments:  Pain, Postural dysfunction, Impaired UE functional use, Decreased strength, Increased fascial restricitons, Increased muscle spasms, Decreased range of motion, Decreased mobility, Decreased activity tolerance, Abnormal gait, Decreased balance, Difficulty walking, Impaired flexibility  Visit  Diagnosis: Acute pain of left knee - Plan: PT plan of care cert/re-cert  Other abnormalities of gait and mobility - Plan: PT plan of care cert/re-cert  Cervicalgia - Plan: PT plan of care cert/re-cert  Pain in thoracic spine - Plan: PT plan of care cert/re-cert  Abnormal posture - Plan: PT plan of care cert/re-cert     Problem List Patient Active Problem List   Diagnosis Date Noted  . Hiatal hernia 05/22/2017  . Primary osteoarthritis of both knees 04/03/2017  . Primary osteoarthritis of first carpometacarpal joint of one hand, right 04/03/2017  . GERD with esophagitis 05/19/2016  . Essential hypertension 05/19/2016  . Glaucoma of both eyes 05/19/2016  . Hyperlipidemia 05/19/2016  . Palpitation 05/19/2016      Laureen Abrahams, PT, DPT 03/29/18 3:17 PM     [facility] [place], [place], [address] Phone: [phone]   Fax:  [phone]  Name: CORNESHA RADZIEWICZ MRN: [id number] Date of Birth: 10-18-1934

## 2018-04-02 ENCOUNTER — Ambulatory Visit (INDEPENDENT_AMBULATORY_CARE_PROVIDER_SITE_OTHER): Payer: Medicare Other | Admitting: Physical Therapy

## 2018-04-02 ENCOUNTER — Encounter: Payer: Self-pay | Admitting: Physical Therapy

## 2018-04-02 DIAGNOSIS — R2689 Other abnormalities of gait and mobility: Secondary | ICD-10-CM | POA: Diagnosis not present

## 2018-04-02 DIAGNOSIS — M25562 Pain in left knee: Secondary | ICD-10-CM

## 2018-04-02 NOTE — Therapy (Signed)
[place], [place], [address] Phone: [phone]   Fax:  [phone]  Physical Therapy Treatment  Patient Details  Name: Misty Dennis MRN: [id number] Date of Birth: [birth year] Referring Provider (PT): Silverio Decamp, MD   Encounter Date: 04/02/2018  PT End of Session - 04/02/18 1428    Visit Number  4    Number of Visits  12    Date for PT Re-Evaluation  04/18/18    PT Start Time  6962    PT Stop Time  1440    PT Time Calculation (min)  42 min    Activity Tolerance  Patient tolerated treatment well;No increased pain    Behavior During Therapy  WFL for tasks assessed/performed       Past Medical History:  Diagnosis Date  . Diverticulitis   . Hyperlipidemia   . Hypertension   . Macular degeneration   . Palpitations     Past Surgical History:  Procedure Laterality Date  . ABDOMINAL HYSTERECTOMY    . CHOLECYSTECTOMY    . OOPHORECTOMY    . TUMOR REMOVAL  2017   May clinic, removed from Spine.     There were no vitals filed for this visit.  Subjective Assessment - 04/02/18 1401    Subjective  pain is worse, had to use walker yesterday due to pain and fear of falling.      Pertinent History  removal of 3 benign tumors in spine (thoracic/lumbar) (2017)    Patient Stated Goals  improve pain    Currently in Pain?  Yes    Pain Score  9     Pain Location  Knee    Pain Orientation  Left    Pain Descriptors / Indicators  Aching;Sharp    Pain Type  Chronic pain    Pain Onset  More than a month ago    Pain Frequency  Constant    Aggravating Factors   weight bearing                       [facility] Adult PT Treatment/Exercise - 04/02/18 1402      Knee/Hip Exercises: Seated   Long Arc Quad  Left;10 reps    Ball Squeeze  10 x 5 sec    Marching  Left;10 reps      Knee/Hip Exercises: Supine   Short Arc Quad Sets  Left;10 reps    Bridges  10 reps    Other Supine  Knee/Hip Exercises  hamstring curls on green physioball x 10 reps      Knee/Hip Exercises: Sidelying   Clams  Left x 10 reps      Moist Heat Therapy   Number Minutes Moist Heat  15 Minutes    Moist Heat Location  Knee      Electrical Stimulation   Electrical Stimulation Location  medial Lt knee    Electrical Stimulation Action  IFC    Electrical Stimulation Parameters  Silverio Decamp, MD    Electrical Stimulation Goals  Pain      Iontophoresis   Type of Iontophoresis  Dexamethasone    Location  Lt pes anserine bursa    Dose  1 cc    Time  6 hour patch      Manual Therapy   Manual Therapy  Soft tissue mobilization    Soft tissue mobilization  IASTM to Lt quads focusing on medial quad  PT Long Term Goals - 03/29/18 1514      PT LONG TERM GOAL #1   Title  independent with HEP    Status  New    Target Date  04/18/18      PT LONG TERM GOAL #2   Title  maintain FOTO score to decrease risk of reinjury    Status  New    Target Date  04/18/18      PT LONG TERM GOAL #3   Title  perform cervical ROM without increase in pain for improved function    Status  New    Target Date  04/18/18      PT LONG TERM GOAL #4   Title  report 50% improvement in pain and symptoms when performing standing ADLs and IADLs    Status  New    Target Date  04/18/18      PT LONG TERM GOAL #5   Title  report pain < 5/10 in Lt knee for improved mobility    Status  New    Target Date  04/18/18      PT LONG TERM GOAL #6   Title  report ability to amb > 10 min without increase in pain for improved function    Status  New    Target Date  04/18/18            Plan - 04/02/18 1428    Clinical Impression Statement  Pt presented today with elevated pain which improved during session.  Exercises are very basic at this time and able to tolerate with mild pain today.  Manual therapy helped to decrease pain and she felt this was helpful.    Rehab Potential  Good     PT Frequency  2x / week   see eval   PT Duration  6 weeks    PT Treatment/Interventions  ADLs/Self Care Home Management;Cryotherapy;Ultrasound;Moist Heat;Electrical Stimulation;Neuromuscular re-education;Therapeutic exercise;Therapeutic activities;Patient/family education;Manual techniques;Dry needling;Passive range of motion;Taping    PT Next Visit Plan  progress postural stregthening, manual/modalities/DN for pain, posture exercises, review knee exercises, assess response to ionto, gentle manual/modalities for pain control    PT Home Exercise Plan  Access Code: [email or web address]     Consulted and Agree with Plan of Care  Patient       Patient will benefit from skilled therapeutic intervention in order to improve the following deficits and impairments:  Pain, Postural dysfunction, Impaired UE functional use, Decreased strength, Increased fascial restricitons, Increased muscle spasms, Decreased range of motion, Decreased mobility, Decreased activity tolerance, Abnormal gait, Decreased balance, Difficulty walking, Impaired flexibility  Visit Diagnosis: Acute pain of left knee  Other abnormalities of gait and mobility     Problem List Patient Active Problem List   Diagnosis Date Noted  . Hiatal hernia 05/22/2017  . Primary osteoarthritis of both knees 04/03/2017  . Primary osteoarthritis of first carpometacarpal joint of one hand, right 04/03/2017  . GERD with esophagitis 05/19/2016  . Essential hypertension 05/19/2016  . Glaucoma of both eyes 05/19/2016  . Hyperlipidemia 05/19/2016  . Palpitation 05/19/2016      Laureen Abrahams, PT, DPT 04/02/18 2:31 PM    [facility] [place], [place], [address] Phone: [phone]   Fax:  [phone]  Name: Misty Dennis MRN: [id number] Date of Birth: Mar 30, 1934

## 2018-04-03 ENCOUNTER — Other Ambulatory Visit: Payer: Self-pay

## 2018-04-03 MED ORDER — PRAVASTATIN SODIUM 20 MG PO TABS: 20.0000 mg | ORAL_TABLET | Freq: Every day | ORAL | 1 refills | Status: DC

## 2018-04-04 ENCOUNTER — Encounter: Payer: Self-pay | Admitting: Physical Therapy

## 2018-04-04 ENCOUNTER — Ambulatory Visit (INDEPENDENT_AMBULATORY_CARE_PROVIDER_SITE_OTHER): Payer: Medicare Other | Admitting: Physical Therapy

## 2018-04-04 DIAGNOSIS — M542 Cervicalgia: Secondary | ICD-10-CM | POA: Diagnosis not present

## 2018-04-04 DIAGNOSIS — R293 Abnormal posture: Secondary | ICD-10-CM | POA: Diagnosis not present

## 2018-04-04 DIAGNOSIS — M25562 Pain in left knee: Secondary | ICD-10-CM

## 2018-04-04 DIAGNOSIS — R2689 Other abnormalities of gait and mobility: Secondary | ICD-10-CM

## 2018-04-04 DIAGNOSIS — M546 Pain in thoracic spine: Secondary | ICD-10-CM

## 2018-04-04 NOTE — Therapy (Signed)
[place], [place], [address] Phone: [phone]   Fax:  [phone]  Physical Therapy Treatment  Patient Details  Name: Misty Dennis MRN: [id number] Date of Birth: July 22, 1934 Referring Provider (PT): Silverio Decamp, MD   Encounter Date: 04/04/2018  PT End of Session - 04/04/18 1515    Visit Number  5    Number of Visits  12    Date for PT Re-Evaluation  04/18/18    PT Start Time  0174    PT Stop Time  9449    PT Time Calculation (min)  43 min    Activity Tolerance  Patient tolerated treatment well;No increased pain    Behavior During Therapy  WFL for tasks assessed/performed       Past Medical History:  Diagnosis Date  . Diverticulitis   . Hyperlipidemia   . Hypertension   . Macular degeneration   . Palpitations     Past Surgical History:  Procedure Laterality Date  . ABDOMINAL HYSTERECTOMY    . CHOLECYSTECTOMY    . OOPHORECTOMY    . TUMOR REMOVAL  2017   May clinic, removed from Spine.     There were no vitals filed for this visit.  Subjective Assessment - 04/04/18 1433    Subjective  knee is feeling a little bit better.  wants knee taped today.    Patient Stated Goals  improve pain    Currently in Pain?  Yes    Pain Score  6     Pain Location  Knee    Pain Orientation  Left    Pain Descriptors / Indicators  Aching;Sharp    Pain Type  Chronic pain    Pain Onset  More than a month ago    Pain Frequency  Constant    Aggravating Factors   weight bearing    Pain Relieving Factors  heat, massage, movement, sitting to rest    Pain Score  0    Pain Location  Neck                       [facility] Adult PT Treatment/Exercise - 04/04/18 1435      Neck Exercises: Theraband   Shoulder External Rotation  10 reps;Red    Shoulder External Rotation Limitations  supine    Horizontal ABduction  10 reps;Red    Horizontal ABduction Limitations  supine      Neck  Exercises: Supine   Other Supine Exercise  decompression series x10 reps       Knee/Hip Exercises: Aerobic   Nustep  L5 x 8 min; UEs/LEs PT present to discuss progress      Knee/Hip Exercises: Supine   Bridges  10 reps      Manual Therapy   Manual Therapy  Soft tissue mobilization;Taping    Soft tissue mobilization  IASTM to Lt quads focusing on medial quad    Kinesiotex  Edema      Kinesiotix   Edema  sensitive skin to Lt knee medial/lateral strips crossing below patella on 30% stretch                  PT Long Term Goals - 03/29/18 1514      PT LONG TERM GOAL #1   Title  independent with HEP    Status  New    Target Date  04/18/18      PT LONG TERM GOAL #2  Title  maintain FOTO score to decrease risk of reinjury    Status  New    Target Date  04/18/18      PT LONG TERM GOAL #3   Title  perform cervical ROM without increase in pain for improved function    Status  New    Target Date  04/18/18      PT LONG TERM GOAL #4   Title  report 50% improvement in pain and symptoms when performing standing ADLs and IADLs    Status  New    Target Date  04/18/18      PT LONG TERM GOAL #5   Title  report pain < 5/10 in Lt knee for improved mobility    Status  New    Target Date  04/18/18      PT LONG TERM GOAL #6   Title  report ability to amb > 10 min without increase in pain for improved function    Status  New    Target Date  04/18/18            Plan - 04/04/18 1515    Clinical Impression Statement  Pt reported some improvement in knee pain overall and tolerated session well today.  Trial of rock tape to knee to see if that is helpful with pain.  Will continue to benefit from PT to maximize function.    Rehab Potential  Good    PT Frequency  2x / week   see eval   PT Duration  6 weeks    PT Treatment/Interventions  ADLs/Self Care Home Management;Cryotherapy;Ultrasound;Moist Heat;Electrical Stimulation;Neuromuscular re-education;Therapeutic  exercise;Therapeutic activities;Patient/family education;Manual techniques;Dry needling;Passive range of motion;Taping    PT Next Visit Plan  assess response to tape, progress postural stregthening, manual/modalities/DN for pain, posture exercises, review knee exercises, assess response to ionto, gentle manual/modalities for pain control    PT Home Exercise Plan  Access Code: [email or web address]     Consulted and Agree with Plan of Care  Patient       Patient will benefit from skilled therapeutic intervention in order to improve the following deficits and impairments:  Pain, Postural dysfunction, Impaired UE functional use, Decreased strength, Increased fascial restricitons, Increased muscle spasms, Decreased range of motion, Decreased mobility, Decreased activity tolerance, Abnormal gait, Decreased balance, Difficulty walking, Impaired flexibility  Visit Diagnosis: Acute pain of left knee  Other abnormalities of gait and mobility  Cervicalgia  Pain in thoracic spine  Abnormal posture     Problem List Patient Active Problem List   Diagnosis Date Noted  . Hiatal hernia 05/22/2017  . Primary osteoarthritis of both knees 04/03/2017  . Primary osteoarthritis of first carpometacarpal joint of one hand, right 04/03/2017  . GERD with esophagitis 05/19/2016  . Essential hypertension 05/19/2016  . Glaucoma of both eyes 05/19/2016  . Hyperlipidemia 05/19/2016  . Palpitation 05/19/2016      Laureen Abrahams, PT, DPT 04/04/18 3:16 PM      [facility] [place], [place], [address] Phone: [phone]   Fax:  [phone]  Name: Misty Dennis MRN: [id number] Date of Birth: Sep 19, 1934

## 2018-04-11 ENCOUNTER — Ambulatory Visit (INDEPENDENT_AMBULATORY_CARE_PROVIDER_SITE_OTHER): Payer: Medicare Other | Admitting: Physical Therapy

## 2018-04-11 ENCOUNTER — Other Ambulatory Visit: Payer: Self-pay

## 2018-04-11 ENCOUNTER — Encounter: Payer: Self-pay | Admitting: Physical Therapy

## 2018-04-11 DIAGNOSIS — R2689 Other abnormalities of gait and mobility: Secondary | ICD-10-CM

## 2018-04-11 DIAGNOSIS — M25562 Pain in left knee: Secondary | ICD-10-CM | POA: Diagnosis not present

## 2018-04-11 NOTE — Therapy (Addendum)
[facility] [place], [place], [address] Phone: [phone]   Fax:  [phone]  Physical Therapy Treatment/Discharge  Patient Details  Name: Misty Dennis MRN: [id number] Date of Birth: 04/05/1934 Referring Provider (PT): Silverio Decamp, MD   Encounter Date: 04/11/2018  PT End of Session - 04/11/18 1429    Visit Number  6    Number of Visits  12    Date for PT Re-Evaluation  04/18/18    PT Start Time  1031    PT Stop Time  1525    PT Time Calculation (min)  56 min    Activity Tolerance  Patient tolerated treatment well    Behavior During Therapy  [facility] for tasks assessed/performed       Past Medical History:  Diagnosis Date  . Diverticulitis   . Hyperlipidemia   . Hypertension   . Macular degeneration   . Palpitations     Past Surgical History:  Procedure Laterality Date  . ABDOMINAL HYSTERECTOMY    . CHOLECYSTECTOMY    . OOPHORECTOMY    . TUMOR REMOVAL  2017   May clinic, removed from Spine.     There were no vitals filed for this visit.  Subjective Assessment - 04/11/18 1436    Subjective  Pt reports she really pushed herself with exercise on Saturday. Sunday the pain in her knee worsened.  "Walking is really hurting".  She reports if she sits and rests (NWB), it feels better.            [facility] PT Assessment - 04/11/18 0001      Assessment   Medical Diagnosis  OA of both knees    Referring Provider (PT)  Silverio Decamp, MD    Onset Date/Surgical Date  03/22/18    Hand Dominance  Right    Next MD Visit  April 2020    Prior Therapy  current for neck      AROM   AROM Assessment Site  Knee    Right/Left Knee  Right;Left    Right Knee Extension  0    Right Knee Flexion  130    Left Knee Extension  0    Left Knee Flexion  128      Flexibility   Quadriceps  Lt 90 deg; Rt 115 deg        [facility] Adult PT Treatment/Exercise - 04/11/18 0001      Ambulation/Gait   Ambulation/Gait Assistance  5: Supervision    Ambulation/Gait Assistance Details  gait initially antalgic; VC given to allow Lt knee flexion during swing through.  With increased distance, pt demo improved knee flexion and reported less pain.     Ambulation Distance (Feet)  80 Feet    Assistive device  None    Gait Pattern  Step-through pattern;Decreased stance time - left;Decreased stride length;Decreased hip/knee flexion - left;Decreased weight shift to left      Knee/Hip Exercises: Stretches   Passive Hamstring Stretch  Left;3 reps;30 seconds   supine with strap   Quad Stretch  Left;4 reps;30 seconds   supine with strap     Knee/Hip Exercises: Aerobic   Nustep  L3-4 x 8 min; UEs/LEs PT present to discuss progress      Knee/Hip Exercises: Supine   Heel Slides  Left;AROM;1 set;10 reps    Bridges Limitations  1 rep; cramping in Lt hamstring; stopped.     Other Supine Knee/Hip Exercises  supine butterfly stretch bilat  x 30 sec; Lt adductor stretch with strap x 30 sec x 2 reps       Moist Heat Therapy   Number Minutes Moist Heat  15 Minutes    Moist Heat Location  Knee   Lt      Electrical Stimulation   Electrical Stimulation Location  medial Lt knee, distal adductor, distal quad     Electrical Stimulation Action  IFC    Electrical Stimulation Parameters  intensity to pt tolerance; 15 min     Electrical Stimulation Goals  Pain      Manual Therapy   Manual Therapy  Myofascial release;Soft tissue mobilization    Soft tissue mobilization  STM to Lt quad, medial hamstring, and adductor distal to mid.  TPR to Lt adductors with contract/relax.      Myofascial Release  MFR to Lt distal to mid quad; MFR to Lt distal to mid adductor       Kinesiotix   Edema  sensitive skin Rock tape applied in a x pattern over Lt pes anserine with 50% stretch, to decompress tissue and decrease pain.                   PT Long Term Goals - 03/29/18 1514      PT LONG TERM GOAL #1   Title   independent with HEP    Status  New    Target Date  04/18/18      PT LONG TERM GOAL #2   Title  maintain FOTO score to decrease risk of reinjury    Status  New    Target Date  04/18/18      PT LONG TERM GOAL #3   Title  perform cervical ROM without increase in pain for improved function    Status  New    Target Date  04/18/18      PT LONG TERM GOAL #4   Title  report 50% improvement in pain and symptoms when performing standing ADLs and IADLs    Status  New    Target Date  04/18/18      PT LONG TERM GOAL #5   Title  report pain < 5/10 in Lt knee for improved mobility    Status  New    Target Date  04/18/18      PT LONG TERM GOAL #6   Title  report ability to amb > 10 min without increase in pain for improved function    Status  New    Target Date  04/18/18            Plan - 04/11/18 1624    Clinical Impression Statement  Pt reporting increased Lt knee pain since last session.  Pt observed with antalgic, guarded gait with minimal Lt knee flexion during sing through.  Decreased Lt quad flexibility and pain reported with prone quad stretch; 90 deg (vs 115 deg and no pain on RLE).  Palpable tightness in distal Lt quad, hamstring and adductors; decreased with manual therapy.  Pt reported decrease in knee pain once she allowed her Lt knee to flex during swing phase of gait and gait was observed to be less antalgic.  Pt may benefit from further diagnostic testing if pain persists. Pt is progressing towards goals.     Rehab Potential  Good    PT Frequency  2x / week    PT Duration  6 weeks    PT Next Visit Plan  assess response to tape, progress  postural stregthening, manual/modalities/DN for pain, posture exercises, add prone quad stretch.    PT Home Exercise Plan  Access Code: [email or web address]     Consulted and Agree with Plan of Care  Patient       Patient will benefit from skilled therapeutic intervention in order to improve the following deficits and impairments:  Pain, Postural  dysfunction, Impaired UE functional use, Decreased strength, Increased fascial restricitons, Increased muscle spasms, Decreased range of motion, Decreased mobility, Decreased activity tolerance, Abnormal gait, Decreased balance, Difficulty walking, Impaired flexibility  Visit Diagnosis: Acute pain of left knee  Other abnormalities of gait and mobility     Problem List Patient Active Problem List   Diagnosis Date Noted  . Hiatal hernia 05/22/2017  . Primary osteoarthritis of both knees 04/03/2017  . Primary osteoarthritis of first carpometacarpal joint of one hand, right 04/03/2017  . GERD with esophagitis 05/19/2016  . Essential hypertension 05/19/2016  . Glaucoma of both eyes 05/19/2016  . Hyperlipidemia 05/19/2016  . Palpitation 05/19/2016   Kerin Perna, PTA 04/11/18 4:31 PM  [facility] [place], [place], [address] Phone: [phone]   Fax:  [phone]  Name: Misty Dennis MRN: [id number] Date of Birth: 12-23-1934     PHYSICAL THERAPY DISCHARGE SUMMARY  Visits from Start of Care: 6  Current functional level related to goals / functional outcomes: See above   Remaining deficits: See above   Education / Equipment: HEP  Plan: Patient agrees to discharge.  Patient goals were not met. Patient is being discharged due to being pleased with the current functional level.  ?????     Laureen Abrahams, PT, DPT 07/16/18 9:51 AM  [facility] [address] [place], [place] [address]  [phone] (office) [phone] (fax)

## 2018-04-13 ENCOUNTER — Encounter: Payer: Medicare Other | Admitting: Rehabilitative and Restorative Service Providers"

## 2018-04-20 ENCOUNTER — Ambulatory Visit: Payer: Medicare Other | Admitting: Sports Medicine

## 2018-04-23 ENCOUNTER — Telehealth: Payer: Self-pay | Admitting: Physical Therapy

## 2018-04-23 NOTE — Telephone Encounter (Signed)
Called pt to follow up on physical therapy.  Pt currently self-quarantined in [facility] community, reports she is doing well with HEP and TENS unit.  Pt requested to check in 1x/wk and therapist advised to call sooner if needed.  Laureen Abrahams, PT, DPT 04/23/18 11:58 AM

## 2018-04-27 ENCOUNTER — Ambulatory Visit: Payer: Medicare Other | Admitting: Sports Medicine

## 2018-05-09 ENCOUNTER — Ambulatory Visit: Payer: Medicare Other

## 2018-05-16 ENCOUNTER — Telehealth: Payer: Self-pay | Admitting: Physical Therapy

## 2018-05-16 NOTE — Telephone Encounter (Signed)
Called to follow up with pt due to clinic closure.  Pt reports she is doing well, and having some increased hip pain (hx of OA) which she feels may be due to increased sedentary lifestyle.  Added 4 additional exercises to previous HEP for hip stretches and strengthening and will mail to pt.  Will plan to follow up with pt in 1-2 weeks.  Pt to call sooner if needed.  Laureen Abrahams, PT, DPT 05/16/18 2:00 PM  Access Code: [email or web address]  URL: [email or web address]  Date: 05/16/2018  Prepared by: Faustino Congress   Exercises  Sidelying Quadriceps Stretch with Strap - 3 reps - 1 sets - 30 sec hold - 2x daily - 7x weekly  Seated Hamstring Stretch - 3 reps - 1 sets - 30 sec hold - 2x daily - 7x weekly  Supine Quad Set - 10 reps - 1 sets - 5 sec hold - 2x daily - 7x weekly  Small Range Straight Leg Raise - 10 reps - 1 sets - 2x daily - 7x weekly  Supine Bridge - 10 reps - 1 sets - 5 sec hold - 2x daily - 7x weekly  Seated Figure 4 Piriformis Stretch - 3 reps - 1 sets - 30 sec hold - 2x daily - 7x weekly  Supine ITB Stretch with Strap - 3 reps - 1 sets - 30 sec hold - 2x daily - 7x weekly  Standing Hip Abduction with Counter Support - 20 reps - 1 sets - 1x daily - 7x weekly  Standing Hip Extension with Counter Support - 20 reps - 1 sets - 1x daily - 7x weekly

## 2018-06-04 DIAGNOSIS — H353231 Exudative age-related macular degeneration, bilateral, with active choroidal neovascularization: Secondary | ICD-10-CM | POA: Diagnosis not present

## 2018-06-12 ENCOUNTER — Ambulatory Visit: Payer: Medicare Other

## 2018-07-09 ENCOUNTER — Telehealth: Payer: Self-pay | Admitting: Family Medicine

## 2018-07-09 ENCOUNTER — Other Ambulatory Visit: Payer: Self-pay

## 2018-07-09 MED ORDER — METOPROLOL TARTRATE 50 MG PO TABS: 50.0000 mg | ORAL_TABLET | Freq: Two times a day (BID) | ORAL | 1 refills | Status: DC

## 2018-07-09 MED ORDER — AMLODIPINE BESYLATE 10 MG PO TABS: 10.0000 mg | ORAL_TABLET | Freq: Every day | ORAL | 1 refills | Status: DC

## 2018-07-09 NOTE — Telephone Encounter (Signed)
I have pt scheduled for 6/17 to F/u on her HTN and she is requesting her BP meds be called in

## 2018-07-09 NOTE — Telephone Encounter (Signed)
This has been completed for patient

## 2018-07-09 NOTE — Telephone Encounter (Signed)
-----   Message from Narda Rutherford, [place] sent at 07/09/2018 11:56 AM EDT ----- Please schedule patient for virtual follow up on HTN. Thanks

## 2018-07-10 ENCOUNTER — Telehealth: Payer: Self-pay | Admitting: Family Medicine

## 2018-07-10 NOTE — Telephone Encounter (Signed)
-----   Message from Narda Rutherford, [place] sent at 07/09/2018 11:56 AM EDT ----- Please schedule patient for virtual follow up on HTN. Thanks

## 2018-07-10 NOTE — Telephone Encounter (Signed)
Patient did not answer, could not leave a voicemail.

## 2018-07-17 ENCOUNTER — Encounter: Payer: Self-pay | Admitting: Family Medicine

## 2018-07-17 ENCOUNTER — Ambulatory Visit (INDEPENDENT_AMBULATORY_CARE_PROVIDER_SITE_OTHER): Payer: Medicare Other | Admitting: Family Medicine

## 2018-07-17 VITALS — BP 156/67 | HR 64 | Temp 98.1°F | Ht 62.5 in | Wt 173.0 lb

## 2018-07-17 DIAGNOSIS — K2100 Gastro-esophageal reflux disease with esophagitis, without bleeding: Secondary | ICD-10-CM

## 2018-07-17 DIAGNOSIS — I10 Essential (primary) hypertension: Secondary | ICD-10-CM

## 2018-07-17 DIAGNOSIS — R002 Palpitations: Secondary | ICD-10-CM | POA: Diagnosis not present

## 2018-07-17 DIAGNOSIS — H3530 Unspecified macular degeneration: Secondary | ICD-10-CM | POA: Insufficient documentation

## 2018-07-17 DIAGNOSIS — M170 Bilateral primary osteoarthritis of knee: Secondary | ICD-10-CM

## 2018-07-17 NOTE — Progress Notes (Signed)
Established Patient Office Visit  Subjective:  Patient ID: Misty Dennis, female    DOB: 1934-10-23  Age: 84 y.o. MRN: [id number]  CC:  Chief Complaint  Patient presents with  . Hypertension  . Arthritis    HPI Misty Dennis presents for   Hypertension- Pt denies chest pain, SOB, dizziness.  She occasionally gets some palpitations..  Taking meds as directed w/o problems.  Denies medication side effects.    F/U arthritis -she says it just bothers her more particularly when the weather changes and when it rains.  She has been seeing 1 of my partners for her knee and says has an injection done and has been doing some home PT because of COVID but feels like it is getting better and a little stronger.  She says it just gave out while she was at [employer] when all of this happened.  GERD-overall she is doing well she is just taking her omeprazole every other day most of the time.  She has lost some weight recently but says she is really been try to cut back on portions and has changed her diet she is been try to eat more salads and fruit.  Past Medical History:  Diagnosis Date  . Diverticulitis   . Hyperlipidemia   . Hypertension   . Macular degeneration   . Palpitations     Past Surgical History:  Procedure Laterality Date  . ABDOMINAL HYSTERECTOMY    . CHOLECYSTECTOMY    . OOPHORECTOMY    . TUMOR REMOVAL  2017   May clinic, removed from Spine.     Family History  Problem Relation Age of Onset  . Hyperlipidemia Mother   . Stroke Mother   . Hypertension Mother   . Alcoholism Father     Social History   Socioeconomic History  . Marital status: Married    Spouse name: [name]  . Number of children: 3  . Years of education: 47  . Highest education level: Not on file  Occupational History  . Occupation: Retired  Scientific laboratory technician  . Financial resource strain: Not on file  . Food insecurity    Worry: Not on file    Inability: Not on file  . Transportation needs     Medical: Not on file    Non-medical: Not on file  Tobacco Use  . Smoking status: Never Smoker  . Smokeless tobacco: Never Used  Substance and Sexual Activity  . Alcohol use: No  . Drug use: No  . Sexual activity: Not Currently    Partners: Male  Lifestyle  . Physical activity    Days per week: Not on file    Minutes per session: Not on file  . Stress: Not on file  Relationships  . Social Herbalist on phone: Not on file    Gets together: Not on file    Attends religious service: Not on file    Active member of club or organization: Not on file    Attends meetings of clubs or organizations: Not on file    Relationship status: Not on file  . Intimate partner violence    Fear of current or ex partner: Not on file    Emotionally abused: Not on file    Physically abused: Not on file    Forced sexual activity: Not on file  Other Topics Concern  . Not on file  Social History Narrative   No regular exercise. 1 caffeinated drink per  day.    Outpatient Medications Prior to Visit  Medication Sig Dispense Refill  . amLODipine (NORVASC) 10 MG tablet Take 1 tablet (10 mg total) by mouth daily. 90 tablet 1  . Brimonidine Tartrate-Timolol (COMBIGAN OP) Apply to eye 2 (two) times daily.    . folic acid (FOLVITE) 1 MG tablet Take 1 mg by mouth daily.    . metoprolol tartrate (LOPRESSOR) 50 MG tablet Take 1 tablet (50 mg total) by mouth 2 (two) times daily. 180 tablet 1  . Multiple Vitamins-Minerals (OCUVITE PO) Take by mouth.    Marland Kitchen omeprazole (PRILOSEC) 20 MG capsule Take 1 capsule (20 mg total) by mouth daily. 90 capsule 3  . pravastatin (PRAVACHOL) 20 MG tablet Take 1 tablet (20 mg total) by mouth daily. 90 tablet 1   No facility-administered medications prior to visit.     Allergies  Allergen Reactions  . Amoxicillin Rash  . Sulfa Antibiotics Rash  . Ivp Dye [Iodinated Diagnostic Agents]   . Meloxicam Nausea And Vomiting  . Sulfur     ROS Review of Systems     Objective:    Physical Exam  Constitutional: She is oriented to person, place, and time. She appears well-developed and well-nourished.  HENT:  Head: Normocephalic and atraumatic.  Cardiovascular: Normal rate, regular rhythm and normal heart sounds.  Pulmonary/Chest: Effort normal and breath sounds normal.  Neurological: She is alert and oriented to person, place, and time.  Skin: Skin is warm and dry.  Psychiatric: She has a normal mood and affect. Her behavior is normal.    BP (!) 156/67   Pulse 64   Temp 98.1 F (36.7 C) (Oral)   Ht 5' 2.5" (1.588 m)   Wt 173 lb (78.5 kg)   SpO2 99%   BMI 31.14 kg/m  Wt Readings from Last 3 Encounters:  07/17/18 173 lb (78.5 kg)  03/07/18 179 lb (81.2 kg)  02/27/18 177 lb (80.3 kg)     There are no preventive care reminders to display for this patient.  There are no preventive care reminders to display for this patient.  No results found for: TSH Lab Results  Component Value Date   WBC 6.8 01/05/2016   HGB 14.7 01/05/2016   HCT 45 01/05/2016   PLT 243 01/05/2016   Lab Results  Component Value Date   NA 142 11/27/2017   K 4.6 11/27/2017   CO2 30 11/27/2017   GLUCOSE 93 11/27/2017   BUN 17 11/27/2017   CREATININE 0.82 11/27/2017   BILITOT 0.9 05/16/2017   ALKPHOS 88 01/05/2016   AST 20 05/16/2017   ALT 24 05/16/2017   PROT 6.9 05/16/2017   CALCIUM 9.6 11/27/2017   Lab Results  Component Value Date   CHOL 179 05/16/2017   Lab Results  Component Value Date   HDL 55 05/16/2017   Lab Results  Component Value Date   LDLCALC 105 (H) 05/16/2017   Lab Results  Component Value Date   TRIG 99 05/16/2017   Lab Results  Component Value Date   CHOLHDL 3.3 05/16/2017   No results found for: HGBA1C    Assessment & Plan:   Problem List Items Addressed This Visit      Cardiovascular and Mediastinum   Essential hypertension - Primary    Blood pressure elevated today.  Though she said she got nervous driving here  in the rain today.  Last time she was here her blood pressure looked great and in fact she is actually  down 5 pounds.  We will just have her check and follow her blood pressure at home over the next couple of weeks and let me know how it is doing.  She is due for labs and encouraged her to go when she is fasting.      Relevant Orders   COMPLETE METABOLIC PANEL WITH GFR   Lipid panel   CBC     Digestive   GERD with esophagitis    Well-controlled with every other day PPI.      Relevant Orders   COMPLETE METABOLIC PANEL WITH GFR   Lipid panel   CBC     Other   Primary osteoarthritis of both knees    Doing her home band exercises and stretches.  She feels like it is getting a little stronger.      Relevant Orders   COMPLETE METABOLIC PANEL WITH GFR   Lipid panel   CBC   Palpitation    Still notices some occasional palpitations but nothing out of the norm and not lasting more than usual.      Age-related macular degeneration    She has been getting injections with her ophthalmologist.         No orders of the defined types were placed in this encounter.   Follow-up: Return in about 6 months (around 01/16/2019) for Hypertension.    Beatrice Lecher, MD

## 2018-07-17 NOTE — Assessment & Plan Note (Signed)
She has been getting injections with her ophthalmologist.

## 2018-07-17 NOTE — Assessment & Plan Note (Addendum)
Blood pressure elevated today.  Though she said she got nervous driving here in the rain today.  Last time she was here her blood pressure looked great and in fact she is actually down 5 pounds.  We will just have her check and follow her blood pressure at home over the next couple of weeks and let me know how it is doing.  She is due for labs and encouraged her to go when she is fasting.

## 2018-07-17 NOTE — Assessment & Plan Note (Signed)
Doing her home band exercises and stretches.  She feels like it is getting a little stronger.

## 2018-07-17 NOTE — Assessment & Plan Note (Signed)
Well-controlled with every other day PPI.

## 2018-07-17 NOTE — Assessment & Plan Note (Signed)
Still notices some occasional palpitations but nothing out of the norm and not lasting more than usual.

## 2018-07-18 ENCOUNTER — Ambulatory Visit: Payer: Medicare Other | Admitting: Family Medicine

## 2018-08-24 DIAGNOSIS — H401111 Primary open-angle glaucoma, right eye, mild stage: Secondary | ICD-10-CM | POA: Diagnosis not present

## 2018-08-24 DIAGNOSIS — H401423 Capsular glaucoma with pseudoexfoliation of lens, left eye, severe stage: Secondary | ICD-10-CM | POA: Diagnosis not present

## 2018-08-24 DIAGNOSIS — H26493 Other secondary cataract, bilateral: Secondary | ICD-10-CM | POA: Diagnosis not present

## 2018-08-24 DIAGNOSIS — H5212 Myopia, left eye: Secondary | ICD-10-CM | POA: Diagnosis not present

## 2018-08-27 DIAGNOSIS — H35433 Paving stone degeneration of retina, bilateral: Secondary | ICD-10-CM | POA: Diagnosis not present

## 2018-08-27 DIAGNOSIS — H353231 Exudative age-related macular degeneration, bilateral, with active choroidal neovascularization: Secondary | ICD-10-CM | POA: Diagnosis not present

## 2018-08-27 DIAGNOSIS — H31113 Age-related choroidal atrophy, bilateral: Secondary | ICD-10-CM | POA: Diagnosis not present

## 2018-08-27 DIAGNOSIS — H31013 Macula scars of posterior pole (postinflammatory) (post-traumatic), bilateral: Secondary | ICD-10-CM | POA: Diagnosis not present

## 2018-09-10 DIAGNOSIS — H6123 Impacted cerumen, bilateral: Secondary | ICD-10-CM | POA: Diagnosis not present

## 2018-10-17 ENCOUNTER — Other Ambulatory Visit: Payer: Self-pay

## 2018-10-17 ENCOUNTER — Ambulatory Visit (INDEPENDENT_AMBULATORY_CARE_PROVIDER_SITE_OTHER): Payer: Medicare Other | Admitting: Family Medicine

## 2018-10-17 DIAGNOSIS — Z23 Encounter for immunization: Secondary | ICD-10-CM

## 2018-10-25 ENCOUNTER — Other Ambulatory Visit: Payer: Self-pay | Admitting: Family Medicine

## 2018-12-03 DIAGNOSIS — H43813 Vitreous degeneration, bilateral: Secondary | ICD-10-CM | POA: Diagnosis not present

## 2018-12-03 DIAGNOSIS — H353231 Exudative age-related macular degeneration, bilateral, with active choroidal neovascularization: Secondary | ICD-10-CM | POA: Diagnosis not present

## 2018-12-03 DIAGNOSIS — D3132 Benign neoplasm of left choroid: Secondary | ICD-10-CM | POA: Diagnosis not present

## 2019-01-17 ENCOUNTER — Ambulatory Visit: Payer: Medicare Other | Admitting: Family Medicine

## 2019-01-21 ENCOUNTER — Other Ambulatory Visit: Payer: Self-pay | Admitting: Family Medicine

## 2019-03-04 ENCOUNTER — Telehealth: Payer: Self-pay

## 2019-03-04 NOTE — Telephone Encounter (Signed)
Misty Dennis left a message stating she just received the 2nd covid vaccine. She wanted to know if it was still ok to have labs done this week.

## 2019-03-04 NOTE — Telephone Encounter (Signed)
Yes, ok for labs.  Please enter vaccines in to immunization tab.

## 2019-03-05 NOTE — Telephone Encounter (Signed)
Pt advised.

## 2019-03-05 NOTE — Progress Notes (Signed)
Subjective:   Misty Dennis is a 85 y.o. female who presents for an Initial Medicare Annual Wellness Visit.  Review of Systems    No ROS.  Medicare Wellness Virtual Visit.  Visual/audio telehealth visit, UTA vital signs.   See social history for additional risk factors.     Cardiac Risk Factors include: advanced age (>62men, >41 women);hypertension;sedentary lifestyle Sleep patterns:  Getting 3-4 hours of sleep a night. Wakes up 5 times a night to void. Wakes up and feels tired and will take a nap.   Home Safety/Smoke Alarms: Feels safe in home. Smoke alarms in place.  Living environment; Lives with husband in apartment and stairs have handrails and elevator as well. Shower is walk in shower with grab bars in place. Seat Belt Safety/Bike Helmet: Wears seat belt.   Female:   Pap- Aged out       Mammo- Aged out      Dexa scan-  Declined will wait     CCS- Aged out     Objective:    Today's Vitals   03/13/19 0818 03/13/19 0824  BP: (!) 147/58 140/64  Pulse: 79   SpO2: 98%   Weight: 171 lb (77.6 kg)   Height: 5\' 3"  (1.6 m)    Body mass index is 30.29 kg/m.  Advanced Directives 03/13/2019 03/07/2018 05/19/2016  Does Patient Have a Medical Advance Directive? Yes Yes Yes  Type of Paramedic of [place];Living will [place];Living will Living will  Does patient want to make changes to medical advance directive? No - Patient declined - -  Copy of [place] in Chart? No - copy requested - -    Current Medications (verified) Outpatient Encounter Medications as of 03/13/2019  Medication Sig  . amLODipine (NORVASC) 10 MG tablet TAKE ONE TABLET BY MOUTH EVERY DAY  . Brimonidine Tartrate-Timolol (COMBIGAN OP) Apply to eye 2 (two) times daily.  . folic acid (FOLVITE) 1 MG tablet Take 1 mg by mouth daily.  . metoprolol tartrate (LOPRESSOR) 50 MG tablet TAKE ONE TABLET BY MOUTH TWICE A DAY  . Multiple Vitamins-Minerals  (OCUVITE PO) Take by mouth.  Marland Kitchen omeprazole (PRILOSEC) 20 MG capsule Take 1 capsule (20 mg total) by mouth daily.  . pravastatin (PRAVACHOL) 20 MG tablet TAKE ONE TABLET BY MOUTH EVERY DAY   No facility-administered encounter medications on file as of 03/13/2019.    Allergies (verified) Amoxicillin, Sulfa antibiotics, Ivp dye [iodinated diagnostic agents], Meloxicam, and Sulfur   History: Past Medical History:  Diagnosis Date  . Diverticulitis   . Hyperlipidemia   . Hypertension   . Macular degeneration   . Palpitations    Past Surgical History:  Procedure Laterality Date  . ABDOMINAL HYSTERECTOMY    . CHOLECYSTECTOMY    . OOPHORECTOMY    . TUMOR REMOVAL  2017   [facility], removed from Spine.    Family History  Problem Relation Age of Onset  . Hyperlipidemia Mother   . Stroke Mother   . Hypertension Mother   . Alcoholism Father    Social History   Socioeconomic History  . Marital status: Married    Spouse name: [name]  . Number of children: 3  . Years of education: 54  . Highest education level: Some college, no degree  Occupational History  . Occupation: Retired    Comment: Science writer  Tobacco Use  . Smoking status: Never Smoker  . Smokeless tobacco: Never Used  Substance and Sexual  Activity  . Alcohol use: No  . Drug use: No  . Sexual activity: Not Currently    Partners: Male  Other Topics Concern  . Not on file  Social History Narrative   No regular exercise. 1 caffeinated drink per day.   Social Determinants of Health   Financial Resource Strain:   . Difficulty of Paying Living Expenses: Not on file  Food Insecurity:   . Worried About Charity fundraiser in the Last Year: Not on file  . Ran Out of Food in the Last Year: Not on file  Transportation Needs:   . Lack of Transportation (Medical): Not on file  . Lack of Transportation (Non-Medical): Not on file  Physical Activity:   . Days of Exercise per Week: Not on file  . Minutes of Exercise per  Session: Not on file  Stress:   . Feeling of Stress : Not on file  Social Connections:   . Frequency of Communication with Friends and Family: Not on file  . Frequency of Social Gatherings with Friends and Family: Not on file  . Attends Religious Services: Not on file  . Active Member of Clubs or Organizations: Not on file  . Attends Archivist Meetings: Not on file  . Marital Status: Not on file    Tobacco Counseling Counseling given: No   Clinical Intake:  Pre-visit preparation completed: Yes  Pain : No/denies pain     Nutritional Risks: None Diabetes: No  How often do you need to have someone help you when you read instructions, pamphlets, or other written materials from your doctor or pharmacy?: 1 - Never What is the last grade level you completed in school?: 12  Interpreter Needed?: No  Information entered by :: Orlie Dakin, LPN   Activities of Daily Living In your present state of health, do you have any difficulty performing the following activities: 03/13/2019  Hearing? N  Vision? Y  Comment macular degeneration  Difficulty concentrating or making decisions? N  Walking or climbing stairs? N  Dressing or bathing? N  Doing errands, shopping? N  Preparing Food and eating ? N  Using the Toilet? N  In the past six months, have you accidently leaked urine? Y  Comment wears depends  Do you have problems with loss of bowel control? N  Managing your Medications? N  Managing your Finances? N  Housekeeping or managing your Housekeeping? N  Some recent data might be hidden     Immunizations and Health Maintenance Immunization History  Administered Date(s) Administered  . Fluad Quad(high Dose 65+) 10/17/2018  . Influenza, High Dose Seasonal PF 10/18/2016, 10/23/2017  . Influenza-Unspecified 11/19/2015  . PFIZER SARS-COV-2 Vaccination 02/04/2019, 03/04/2019   Health Maintenance Due  Topic Date Due  . DEXA SCAN  05/17/2001    Patient Care  Team: Hali Marry, MD as PCP - General (Family Medicine)  Indicate any recent Medical Services you may have received from other than [facility] providers in the past year (date may be approximate).     Assessment:   This is a routine wellness examination for Misty Dennis.Physical assessment deferred to PCP.   Hearing/Vision screen  Hearing Screening   125Hz  250Hz  500Hz  1000Hz  2000Hz  3000Hz  4000Hz  6000Hz  8000Hz   Right ear:           Left ear:           Comments: Hearing test not performed- wearing mask due to COVID so whisper test not done.  Vision Screening Comments:  Has macular degeneration so vision test not done  Dietary issues and exercise activities discussed: Current Exercise Habits: The patient does not participate in regular exercise at present, Exercise limited by: None identified Diet Eats a healthy diet of vegetables, fruits and proteins. Breakfast: Eggs, belvita bar or english muffin with peanut butter Lunch: peanut butter and jelly sandwich banana Dinner: Meat and some vegetables.  Lately eating a lot of pre-packaged foods.      Goals    . Patient Stated     Patient stated would like to be able to have better sleeping habits.      Depression Screen PHQ 2/9 Scores 03/13/2019 11/27/2017 05/22/2017 11/18/2016 11/18/2016 05/19/2016  PHQ - 2 Score 1 0 0 4 0 0  PHQ- 9 Score - - - 4 - -    Fall Risk Fall Risk  03/13/2019 07/17/2018 11/27/2017 05/22/2017 11/18/2016  Falls in the past year? 0 0 No No No  Risk for fall due to : Impaired balance/gait - - - -  Follow up Falls prevention discussed - - - -    Is the patient's home free of loose throw rugs in walkways, pet beds, electrical cords, etc?   yes      Grab bars in the bathroom? yes      Handrails on the stairs?   yes      Adequate lighting?   yes   Cognitive Function:     6CIT Screen 03/13/2019  What Year? 0 points  What month? 0 points  What time? 0 points  Count back from 20 0 points  Months in reverse 4  points  Repeat phrase 4 points  Total Score 8    Screening Tests Health Maintenance  Topic Date Due  . DEXA SCAN  05/17/2001  . PNA vac Low Risk Adult (1 of 2 - PCV13) 03/02/2021 (Originally 05/17/2001)  . TETANUS/TDAP  03/02/2028 (Originally 05/18/1953)  . INFLUENZA VACCINE  Completed      Plan:    Please schedule your next medicare wellness visit with me in 1 yr.  Misty Dennis , Thank you for taking time to come for your Medicare Wellness Visit. I appreciate your ongoing commitment to your health goals. Please review the following plan we discussed and let me know if I can assist you in the future. Bring a copy of your living will and/or healthcare power of attorney to your next office visit.    These are the goals we discussed: Goals    . Patient Stated     Patient stated would like to be able to have better sleeping habits.       This is a list of the screening recommended for you and due dates:  Health Maintenance  Topic Date Due  . DEXA scan (bone density measurement)  05/17/2001  . Pneumonia vaccines (1 of 2 - PCV13) 03/02/2021*  . Tetanus Vaccine  03/02/2028*  . Flu Shot  Completed  *Topic was postponed. The date shown is not the original due date.     I have personally reviewed and noted the following in the patient's chart:   . Medical and social history . Use of alcohol, tobacco or illicit drugs  . Current medications and supplements . Functional ability and status . Nutritional status . Physical activity . Advanced directives . List of other physicians . Hospitalizations, surgeries, and ER visits in previous 12 months . Vitals . Screenings to include cognitive, depression, and falls . Referrals and appointments  In  addition, I have reviewed and discussed with patient certain preventive protocols, quality metrics, and best practice recommendations. A written personalized care plan for preventive services as well as general preventive health  recommendations were provided to patient.     Joanne Chars, LPN   [id number]

## 2019-03-06 DIAGNOSIS — I10 Essential (primary) hypertension: Secondary | ICD-10-CM | POA: Diagnosis not present

## 2019-03-06 DIAGNOSIS — K2100 Gastro-esophageal reflux disease with esophagitis, without bleeding: Secondary | ICD-10-CM | POA: Diagnosis not present

## 2019-03-06 DIAGNOSIS — M170 Bilateral primary osteoarthritis of knee: Secondary | ICD-10-CM | POA: Diagnosis not present

## 2019-03-06 LAB — CBC
HCT: 41.5 % (ref 35.0–45.0)
Hemoglobin: 13.9 g/dL (ref 11.7–15.5)
MCH: 31 pg (ref 27.0–33.0)
MCHC: 33.5 g/dL (ref 32.0–36.0)
MCV: 92.6 fL (ref 80.0–100.0)
MPV: 11.7 fL (ref 7.5–12.5)
Platelets: 217 10*3/uL (ref 140–400)
RBC: 4.48 10*6/uL (ref 3.80–5.10)
RDW: 12.9 % (ref 11.0–15.0)
WBC: 5.2 10*3/uL (ref 3.8–10.8)

## 2019-03-06 LAB — LIPID PANEL
Cholesterol: 160 mg/dL (ref <200)
HDL: 45 mg/dL — ABNORMAL LOW (ref >=50)
LDL Cholesterol (Calc): 89 mg/dL (calc)
Non-HDL Cholesterol (Calc): 115 mg/dL (calc) (ref <130)
Total CHOL/HDL Ratio: 3.6 (calc) (ref <5.0)
Triglycerides: 154 mg/dL — ABNORMAL HIGH (ref <150)

## 2019-03-06 LAB — COMPLETE METABOLIC PANEL WITH GFR
AG Ratio: 1.7 (calc) (ref 1.0–2.5)
ALT: 13 U/L (ref 6–29)
AST: 18 U/L (ref 10–35)
Albumin: 4.2 g/dL (ref 3.6–5.1)
Alkaline phosphatase (APISO): 68 U/L (ref 37–153)
BUN: 14 mg/dL (ref 7–25)
CO2: 30 mmol/L (ref 20–32)
Calcium: 9.6 mg/dL (ref 8.6–10.4)
Chloride: 104 mmol/L (ref 98–110)
Creat: 0.71 mg/dL (ref 0.60–0.88)
GFR, Est African American: 92 mL/min/{1.73_m2} (ref >=60)
GFR, Est Non African American: 79 mL/min/{1.73_m2} (ref >=60)
Globulin: 2.5 g/dL (calc) (ref 1.9–3.7)
Glucose, Bld: 125 mg/dL — ABNORMAL HIGH (ref 65–99)
Potassium: 3.8 mmol/L (ref 3.5–5.3)
Sodium: 141 mmol/L (ref 135–146)
Total Bilirubin: 1.1 mg/dL (ref 0.2–1.2)
Total Protein: 6.7 g/dL (ref 6.1–8.1)

## 2019-03-13 ENCOUNTER — Ambulatory Visit (INDEPENDENT_AMBULATORY_CARE_PROVIDER_SITE_OTHER): Payer: Medicare Other | Admitting: *Deleted

## 2019-03-13 VITALS — BP 140/64 | HR 79 | Ht 63.0 in | Wt 171.0 lb

## 2019-03-13 DIAGNOSIS — Z0000 Encounter for general adult medical examination without abnormal findings: Secondary | ICD-10-CM | POA: Diagnosis not present

## 2019-03-13 MED ORDER — METOPROLOL TARTRATE 50 MG PO TABS: 50.0000 mg | ORAL_TABLET | Freq: Two times a day (BID) | ORAL | 0 refills | Status: DC

## 2019-03-13 MED ORDER — PRAVASTATIN SODIUM 20 MG PO TABS: 20.0000 mg | ORAL_TABLET | Freq: Every day | ORAL | 1 refills | Status: DC

## 2019-03-13 MED ORDER — AMLODIPINE BESYLATE 10 MG PO TABS: 10.0000 mg | ORAL_TABLET | Freq: Every day | ORAL | 0 refills | Status: DC

## 2019-03-13 NOTE — Patient Instructions (Signed)
Please schedule your next medicare wellness visit with me in 1 yr.  Misty Dennis , Thank you for taking time to come for your Medicare Wellness Visit. I appreciate your ongoing commitment to your health goals. Please review the following plan we discussed and let me know if I can assist you in the future. Bring a copy of your living will and/or healthcare power of attorney to your next office visit.  These are the goals we discussed: Goals    . Patient Stated     Patient stated would like to be able to have better sleeping habits.

## 2019-04-01 DIAGNOSIS — H35433 Paving stone degeneration of retina, bilateral: Secondary | ICD-10-CM | POA: Diagnosis not present

## 2019-04-01 DIAGNOSIS — H353231 Exudative age-related macular degeneration, bilateral, with active choroidal neovascularization: Secondary | ICD-10-CM | POA: Diagnosis not present

## 2019-04-01 DIAGNOSIS — D3132 Benign neoplasm of left choroid: Secondary | ICD-10-CM | POA: Diagnosis not present

## 2019-04-01 DIAGNOSIS — H43813 Vitreous degeneration, bilateral: Secondary | ICD-10-CM | POA: Diagnosis not present

## 2019-04-01 DIAGNOSIS — H31113 Age-related choroidal atrophy, bilateral: Secondary | ICD-10-CM | POA: Diagnosis not present

## 2019-04-12 DIAGNOSIS — H26493 Other secondary cataract, bilateral: Secondary | ICD-10-CM | POA: Diagnosis not present

## 2019-04-12 DIAGNOSIS — H401411 Capsular glaucoma with pseudoexfoliation of lens, right eye, mild stage: Secondary | ICD-10-CM | POA: Diagnosis not present

## 2019-04-12 DIAGNOSIS — H401423 Capsular glaucoma with pseudoexfoliation of lens, left eye, severe stage: Secondary | ICD-10-CM | POA: Diagnosis not present

## 2019-05-23 DIAGNOSIS — H26492 Other secondary cataract, left eye: Secondary | ICD-10-CM | POA: Diagnosis not present

## 2019-06-06 DIAGNOSIS — H26491 Other secondary cataract, right eye: Secondary | ICD-10-CM | POA: Diagnosis not present

## 2019-06-18 DIAGNOSIS — H5340 Unspecified visual field defects: Secondary | ICD-10-CM | POA: Diagnosis not present

## 2019-07-04 ENCOUNTER — Other Ambulatory Visit: Payer: Self-pay | Admitting: Family Medicine

## 2019-07-05 ENCOUNTER — Telehealth: Payer: Self-pay | Admitting: Family Medicine

## 2019-07-05 NOTE — Telephone Encounter (Signed)
Pt will make appt!

## 2019-07-17 ENCOUNTER — Encounter: Payer: Self-pay | Admitting: Family Medicine

## 2019-07-17 ENCOUNTER — Other Ambulatory Visit: Payer: Self-pay

## 2019-07-17 ENCOUNTER — Ambulatory Visit (INDEPENDENT_AMBULATORY_CARE_PROVIDER_SITE_OTHER): Payer: Medicare Other | Admitting: Family Medicine

## 2019-07-17 VITALS — BP 123/49 | HR 61 | Ht 63.0 in | Wt 163.0 lb

## 2019-07-17 DIAGNOSIS — R7309 Other abnormal glucose: Secondary | ICD-10-CM

## 2019-07-17 DIAGNOSIS — F4321 Adjustment disorder with depressed mood: Secondary | ICD-10-CM | POA: Insufficient documentation

## 2019-07-17 DIAGNOSIS — R351 Nocturia: Secondary | ICD-10-CM

## 2019-07-17 DIAGNOSIS — F5101 Primary insomnia: Secondary | ICD-10-CM

## 2019-07-17 DIAGNOSIS — R634 Abnormal weight loss: Secondary | ICD-10-CM | POA: Diagnosis not present

## 2019-07-17 LAB — POCT GLYCOSYLATED HEMOGLOBIN (HGB A1C): Hemoglobin A1C: 5.4 % (ref 4.0–5.6)

## 2019-07-17 MED ORDER — DOXEPIN HCL 10 MG PO CAPS: 10.0000 mg | ORAL_CAPSULE | Freq: Every day | ORAL | 0 refills | Status: AC

## 2019-07-17 NOTE — Assessment & Plan Note (Signed)
F/U in 1 month for wegit check.  Again discussed mood and refer for counselor and possible medication.

## 2019-07-17 NOTE — Assessment & Plan Note (Addendum)
New problem: Check UA for infection. Possible OAB.

## 2019-07-17 NOTE — Assessment & Plan Note (Addendum)
New problem. Discussed trial of doxepin PRN. If not helping then please let me now. Consider trazodone if not helping.

## 2019-07-17 NOTE — Progress Notes (Signed)
Established Patient Office Visit  Subjective:  Patient ID: Misty Dennis, female    DOB: 1934-10-27  Age: 85 y.o. MRN: [id number]  CC:  Chief Complaint  Patient presents with  . abnormal weight loss    HPI [name] presents for abnormal weight loss.  In February she was 171 pounds on our scale.  She is down to 163 pounds today.  Blood count performed in February as well as some blood work done at that time.  Her fasting blood sugar was 125. She is here with her daughter today.   Hasn't been sleeping well.  About 3 night per week sleep is OK but the other nights can't shut her mind off.  She is the primary care give for her husband who has Parkinson and whose dementia is worsening. She now has some help with cleaning the home and helping her husband bath and that has been very helpful.  Her brother also recently died and that has been upsetting.   Stools have been hard and then watery. Says has had these problems lifelong. No blood in the stool.  Hx of diverticulitis.    Past Medical History:  Diagnosis Date  . Diverticulitis   . Hyperlipidemia   . Hypertension   . Macular degeneration   . Palpitations     Past Surgical History:  Procedure Laterality Date  . ABDOMINAL HYSTERECTOMY    . CHOLECYSTECTOMY    . OOPHORECTOMY    . TUMOR REMOVAL  2017   [facility] clinic, removed from Spine.     Family History  Problem Relation Age of Onset  . Hyperlipidemia Mother   . Stroke Mother   . Hypertension Mother   . Alcoholism Father     Social History   Socioeconomic History  . Marital status: Married    Spouse name: [name]  . Number of children: 3  . Years of education: 47  . Highest education level: Some college, no degree  Occupational History  . Occupation: Retired    Comment: Science writer  Tobacco Use  . Smoking status: Never Smoker  . Smokeless tobacco: Never Used  Vaping Use  . Vaping Use: Never used  Substance and Sexual Activity  . Alcohol use: No  . Drug use: No   . Sexual activity: Not Currently    Partners: Male  Other Topics Concern  . Not on file  Social History Narrative   No regular exercise. 1 caffeinated drink per day.   Social Determinants of Health   Financial Resource Strain:   . Difficulty of Paying Living Expenses:   Food Insecurity:   . Worried About Charity fundraiser in the Last Year:   . Arboriculturist in the Last Year:   Transportation Needs:   . Film/video editor (Medical):   Marland Kitchen Lack of Transportation (Non-Medical):   Physical Activity:   . Days of Exercise per Week:   . Minutes of Exercise per Session:   Stress:   . Feeling of Stress :   Social Connections:   . Frequency of Communication with Friends and Family:   . Frequency of Social Gatherings with Friends and Family:   . Attends Religious Services:   . Active Member of Clubs or Organizations:   . Attends Archivist Meetings:   Marland Kitchen Marital Status:   Intimate Partner Violence:   . Fear of Current or Ex-Partner:   . Emotionally Abused:   Marland Kitchen Physically Abused:   . Sexually Abused:  Outpatient Medications Prior to Visit  Medication Sig Dispense Refill  . amLODipine (NORVASC) 10 MG tablet TAKE ONE TABLET BY MOUTH EVERY DAY 90 tablet 0  . Brimonidine Tartrate-Timolol (COMBIGAN OP) Apply to eye 2 (two) times daily.    . folic acid (FOLVITE) 1 MG tablet Take 1 mg by mouth daily.    . metoprolol tartrate (LOPRESSOR) 50 MG tablet TAKE ONE TABLET BY MOUTH TWICE A DAY 180 tablet 0  . Multiple Vitamins-Minerals (OCUVITE PO) Take by mouth.    Marland Kitchen omeprazole (PRILOSEC) 20 MG capsule Take 1 capsule (20 mg total) by mouth daily. 90 capsule 3  . pravastatin (PRAVACHOL) 20 MG tablet Take 1 tablet (20 mg total) by mouth daily. 90 tablet 1   No facility-administered medications prior to visit.    Allergies  Allergen Reactions  . Amoxicillin Rash  . Sulfa Antibiotics Rash  . Ivp Dye [Iodinated Diagnostic Agents]   . Meloxicam Nausea And Vomiting  . Sulfur      ROS Review of Systems    Objective:    Physical Exam  BP (!) 123/49   Pulse 61   Ht 5\' 3"  (1.6 m)   Wt 163 lb (73.9 kg)   SpO2 98%   BMI 28.87 kg/m  Wt Readings from Last 3 Encounters:  07/17/19 163 lb (73.9 kg)  03/13/19 171 lb (77.6 kg)  07/17/18 173 lb (78.5 kg)     Health Maintenance Due  Topic Date Due  . DEXA SCAN  Never done    There are no preventive care reminders to display for this patient.  No results found for: TSH Lab Results  Component Value Date   WBC 5.2 03/06/2019   HGB 13.9 03/06/2019   HCT 41.5 03/06/2019   MCV 92.6 03/06/2019   PLT 217 03/06/2019   Lab Results  Component Value Date   NA 141 03/06/2019   K 3.8 03/06/2019   CO2 30 03/06/2019   GLUCOSE 125 (H) 03/06/2019   BUN 14 03/06/2019   CREATININE 0.71 03/06/2019   BILITOT 1.1 03/06/2019   ALKPHOS 88 01/05/2016   AST 18 03/06/2019   ALT 13 03/06/2019   PROT 6.7 03/06/2019   CALCIUM 9.6 03/06/2019   Lab Results  Component Value Date   CHOL 160 03/06/2019   Lab Results  Component Value Date   HDL 45 (L) 03/06/2019   Lab Results  Component Value Date   LDLCALC 89 03/06/2019   Lab Results  Component Value Date   TRIG 154 (H) 03/06/2019   Lab Results  Component Value Date   CHOLHDL 3.6 03/06/2019   Lab Results  Component Value Date   HGBA1C 5.4 07/17/2019      Assessment & Plan:   Problem List Items Addressed This Visit      Other   Primary insomnia    New problem. Discussed trial of doxepin PRN. If not helping then please let me now. Consider trazodone if not helping.       Nocturia    New problem: Check UA for infection. Possible OAB.        Relevant Orders   Urinalysis with Culture Reflex   Grief    New problem. Discussed referral to counselor/therapist. Prefers to be seen locally and in person.       Abnormal weight loss    F/U in 1 month for wegit check.  Again discussed mood and refer for counselor and possible medication.  Other Visit Diagnoses    Abnormal glucose    -  Primary   Relevant Orders   POCT glycosylated hemoglobin (Hb A1C) (Completed)      Meds ordered this encounter  Medications  . doxepin (SINEQUAN) 10 MG capsule    Sig: Take 1 capsule (10 mg total) by mouth at bedtime.    Dispense:  30 capsule    Refill:  0    Follow-up: Return in about 4 weeks (around 08/14/2019) for New start medication, sleep.    Beatrice Lecher, MD

## 2019-07-17 NOTE — Assessment & Plan Note (Signed)
New problem. Discussed referral to counselor/therapist. Prefers to be seen locally and in person.

## 2019-08-09 DIAGNOSIS — R351 Nocturia: Secondary | ICD-10-CM | POA: Diagnosis not present

## 2019-08-10 LAB — URINALYSIS W MICROSCOPIC + REFLEX CULTURE
Bacteria, UA: NONE SEEN /HPF
Bilirubin Urine: NEGATIVE
Glucose, UA: NEGATIVE
Hgb urine dipstick: NEGATIVE
Hyaline Cast: NONE SEEN /LPF
Ketones, ur: NEGATIVE
Leukocyte Esterase: NEGATIVE
Nitrites, Initial: NEGATIVE
Protein, ur: NEGATIVE
RBC / HPF: NONE SEEN /HPF (ref 0–2)
Specific Gravity, Urine: 1.011 (ref 1.001–1.03)
Squamous Epithelial / HPF: NONE SEEN /HPF (ref <=5)
WBC, UA: NONE SEEN /HPF (ref 0–5)
pH: <=5 (ref 5.0–8.0)

## 2019-08-10 LAB — NO CULTURE INDICATED

## 2019-08-13 ENCOUNTER — Telehealth: Payer: Self-pay

## 2019-08-13 NOTE — Telephone Encounter (Signed)
Patient's daughter called back - she does not want her mother to go to Urgent Care because she has not had good service there in the past. Cancellation occurred on Dr Mcneil Sober schedule and I was able to get patient scheduled for 2:45 tomorrow.

## 2019-08-13 NOTE — Telephone Encounter (Signed)
Patient called wanted to verify her urine results. Advised her no culture was done because dip was negative.   Patient states she has extreme hip and back pain, which she believes to be arthritis. Wanted to see Dr T, soonest appointment was next week. Patient did not want to wait to see Dr Madilyn Fireman on Thursday and states she wanted to go to Urgent Care for x-rays and pain/sleeping meds. I provided patient hours to urgent care, per her request.   Patient requested FYI be sent to Dr Madilyn Fireman just to let her know she is going to see Urgent Care

## 2019-08-14 ENCOUNTER — Encounter: Payer: Self-pay | Admitting: Sports Medicine

## 2019-08-14 ENCOUNTER — Ambulatory Visit (INDEPENDENT_AMBULATORY_CARE_PROVIDER_SITE_OTHER): Payer: Medicare Other

## 2019-08-14 ENCOUNTER — Ambulatory Visit (INDEPENDENT_AMBULATORY_CARE_PROVIDER_SITE_OTHER): Payer: Medicare Other | Admitting: Sports Medicine

## 2019-08-14 DIAGNOSIS — M5441 Lumbago with sciatica, right side: Secondary | ICD-10-CM | POA: Diagnosis not present

## 2019-08-14 DIAGNOSIS — M545 Low back pain, unspecified: Secondary | ICD-10-CM | POA: Insufficient documentation

## 2019-08-14 MED ORDER — HYDROCODONE-ACETAMINOPHEN 5-325 MG PO TABS: 1.0000 | ORAL_TABLET | Freq: Three times a day (TID) | ORAL | 0 refills | Status: DC | PRN

## 2019-08-14 MED ORDER — PREDNISONE 50 MG PO TABS: ORAL_TABLET | ORAL | 0 refills | Status: AC

## 2019-08-14 NOTE — Progress Notes (Signed)
    Procedures performed today:    None.  Independent interpretation of notes and tests performed by another provider:   None.  Brief History, Exam, Impression, and Recommendations:    Acute low back pain This is a pleasant 85 year old female, without injury she has developed acute low back pain, axial and discogenic, worse with sitting, flexion, Valsalva, radiating down the right leg but not past the mid lower leg. No bowel or bladder dysfunction, no red flags, we will start conservatively with prednisone, hydrocodone, formal physical therapy at home, x-rays. Return to see me in 6 weeks, MRI for interventional planning if no better.    ___________________________________________ Gwen Her. Dianah Field, M.D., ABFM., CAQSM. Primary Care and [place] Instructor of [place] [facility]

## 2019-08-14 NOTE — Assessment & Plan Note (Addendum)
This is a pleasant 85 year old female, without injury she has developed acute low back pain, axial and discogenic, worse with sitting, flexion, Valsalva, radiating down the right leg but not past the mid lower leg. No bowel or bladder dysfunction, no red flags, we will start conservatively with prednisone, hydrocodone, formal physical therapy at home, x-rays. Return to see me in 6 weeks, MRI for interventional planning if no better.

## 2019-08-16 DIAGNOSIS — F5101 Primary insomnia: Secondary | ICD-10-CM | POA: Diagnosis not present

## 2019-08-16 DIAGNOSIS — R351 Nocturia: Secondary | ICD-10-CM | POA: Diagnosis not present

## 2019-08-16 DIAGNOSIS — K5790 Diverticulosis of intestine, part unspecified, without perforation or abscess without bleeding: Secondary | ICD-10-CM | POA: Diagnosis not present

## 2019-08-16 DIAGNOSIS — E785 Hyperlipidemia, unspecified: Secondary | ICD-10-CM | POA: Diagnosis not present

## 2019-08-16 DIAGNOSIS — M5441 Lumbago with sciatica, right side: Secondary | ICD-10-CM | POA: Diagnosis not present

## 2019-08-16 DIAGNOSIS — R634 Abnormal weight loss: Secondary | ICD-10-CM | POA: Diagnosis not present

## 2019-08-18 DIAGNOSIS — Z91041 Radiographic dye allergy status: Secondary | ICD-10-CM | POA: Diagnosis not present

## 2019-08-18 DIAGNOSIS — M5187 Other intervertebral disc disorders, lumbosacral region: Secondary | ICD-10-CM | POA: Diagnosis not present

## 2019-08-18 DIAGNOSIS — M1990 Unspecified osteoarthritis, unspecified site: Secondary | ICD-10-CM | POA: Diagnosis not present

## 2019-08-18 DIAGNOSIS — R0789 Other chest pain: Secondary | ICD-10-CM | POA: Diagnosis not present

## 2019-08-18 DIAGNOSIS — M5136 Other intervertebral disc degeneration, lumbar region: Secondary | ICD-10-CM | POA: Diagnosis not present

## 2019-08-18 DIAGNOSIS — Z9049 Acquired absence of other specified parts of digestive tract: Secondary | ICD-10-CM | POA: Diagnosis not present

## 2019-08-18 DIAGNOSIS — I10 Essential (primary) hypertension: Secondary | ICD-10-CM | POA: Diagnosis not present

## 2019-08-18 DIAGNOSIS — R918 Other nonspecific abnormal finding of lung field: Secondary | ICD-10-CM | POA: Diagnosis not present

## 2019-08-18 DIAGNOSIS — M5489 Other dorsalgia: Secondary | ICD-10-CM | POA: Diagnosis not present

## 2019-08-18 DIAGNOSIS — K449 Diaphragmatic hernia without obstruction or gangrene: Secondary | ICD-10-CM | POA: Diagnosis not present

## 2019-08-18 DIAGNOSIS — M545 Low back pain: Secondary | ICD-10-CM | POA: Diagnosis not present

## 2019-08-18 DIAGNOSIS — M5186 Other intervertebral disc disorders, lumbar region: Secondary | ICD-10-CM | POA: Diagnosis not present

## 2019-08-18 DIAGNOSIS — M48061 Spinal stenosis, lumbar region without neurogenic claudication: Secondary | ICD-10-CM | POA: Diagnosis not present

## 2019-08-18 DIAGNOSIS — Z7982 Long term (current) use of aspirin: Secondary | ICD-10-CM | POA: Diagnosis not present

## 2019-08-18 DIAGNOSIS — Z79899 Other long term (current) drug therapy: Secondary | ICD-10-CM | POA: Diagnosis not present

## 2019-08-18 DIAGNOSIS — Z882 Allergy status to sulfonamides status: Secondary | ICD-10-CM | POA: Diagnosis not present

## 2019-08-18 DIAGNOSIS — R1030 Lower abdominal pain, unspecified: Secondary | ICD-10-CM | POA: Diagnosis not present

## 2019-08-18 DIAGNOSIS — R52 Pain, unspecified: Secondary | ICD-10-CM | POA: Diagnosis not present

## 2019-08-18 DIAGNOSIS — R079 Chest pain, unspecified: Secondary | ICD-10-CM | POA: Diagnosis not present

## 2019-08-18 DIAGNOSIS — Z880 Allergy status to penicillin: Secondary | ICD-10-CM | POA: Diagnosis not present

## 2019-08-18 DIAGNOSIS — K5900 Constipation, unspecified: Secondary | ICD-10-CM | POA: Diagnosis not present

## 2019-08-20 DIAGNOSIS — F5101 Primary insomnia: Secondary | ICD-10-CM | POA: Diagnosis not present

## 2019-08-20 DIAGNOSIS — R634 Abnormal weight loss: Secondary | ICD-10-CM | POA: Diagnosis not present

## 2019-08-20 DIAGNOSIS — E785 Hyperlipidemia, unspecified: Secondary | ICD-10-CM | POA: Diagnosis not present

## 2019-08-20 DIAGNOSIS — M5441 Lumbago with sciatica, right side: Secondary | ICD-10-CM | POA: Diagnosis not present

## 2019-08-20 DIAGNOSIS — K5790 Diverticulosis of intestine, part unspecified, without perforation or abscess without bleeding: Secondary | ICD-10-CM | POA: Diagnosis not present

## 2019-08-20 DIAGNOSIS — R351 Nocturia: Secondary | ICD-10-CM | POA: Diagnosis not present

## 2019-08-22 DIAGNOSIS — F5101 Primary insomnia: Secondary | ICD-10-CM | POA: Diagnosis not present

## 2019-08-22 DIAGNOSIS — E785 Hyperlipidemia, unspecified: Secondary | ICD-10-CM | POA: Diagnosis not present

## 2019-08-22 DIAGNOSIS — R634 Abnormal weight loss: Secondary | ICD-10-CM | POA: Diagnosis not present

## 2019-08-22 DIAGNOSIS — M5441 Lumbago with sciatica, right side: Secondary | ICD-10-CM | POA: Diagnosis not present

## 2019-08-22 DIAGNOSIS — K5790 Diverticulosis of intestine, part unspecified, without perforation or abscess without bleeding: Secondary | ICD-10-CM | POA: Diagnosis not present

## 2019-08-22 DIAGNOSIS — R351 Nocturia: Secondary | ICD-10-CM | POA: Diagnosis not present

## 2019-08-23 ENCOUNTER — Telehealth: Payer: Self-pay | Admitting: *Deleted

## 2019-08-23 MED ORDER — HYDROCODONE-ACETAMINOPHEN 10-325 MG PO TABS: 1.0000 | ORAL_TABLET | Freq: Three times a day (TID) | ORAL | 0 refills | Status: AC | PRN

## 2019-08-23 NOTE — Telephone Encounter (Signed)
I see that she has not yet done any physical therapy, I am going to increase hydrocodone to the full dose, she needs to do some physical therapy and if still no improvement we will get an MRI and proceed with injections

## 2019-08-23 NOTE — Telephone Encounter (Signed)
Pt notified of provider's recommendations.  She said that they have started her home PT.

## 2019-08-23 NOTE — Telephone Encounter (Signed)
Pt left vm this morning letting you know that she is still having a tremendous amount of back pain.  She said that she went to the ED on Sunday and they gave her a shot of morphine.  She said she's taken all the medication you gave her but she is hurting so bad still.  She wants to know what she needs to do now.  Please advise.

## 2019-08-27 DIAGNOSIS — F5101 Primary insomnia: Secondary | ICD-10-CM | POA: Diagnosis not present

## 2019-08-27 DIAGNOSIS — M5441 Lumbago with sciatica, right side: Secondary | ICD-10-CM | POA: Diagnosis not present

## 2019-08-27 DIAGNOSIS — E785 Hyperlipidemia, unspecified: Secondary | ICD-10-CM | POA: Diagnosis not present

## 2019-08-27 DIAGNOSIS — K5790 Diverticulosis of intestine, part unspecified, without perforation or abscess without bleeding: Secondary | ICD-10-CM | POA: Diagnosis not present

## 2019-08-27 DIAGNOSIS — R351 Nocturia: Secondary | ICD-10-CM | POA: Diagnosis not present

## 2019-08-27 DIAGNOSIS — R634 Abnormal weight loss: Secondary | ICD-10-CM | POA: Diagnosis not present

## 2019-08-29 ENCOUNTER — Other Ambulatory Visit: Payer: Self-pay

## 2019-08-29 MED ORDER — METOPROLOL TARTRATE 50 MG PO TABS: 50.0000 mg | ORAL_TABLET | Freq: Two times a day (BID) | ORAL | 0 refills | Status: DC

## 2019-08-29 MED ORDER — PRAVASTATIN SODIUM 20 MG PO TABS: 20.0000 mg | ORAL_TABLET | Freq: Every day | ORAL | 1 refills | Status: AC

## 2019-08-29 MED ORDER — AMLODIPINE BESYLATE 10 MG PO TABS: 10.0000 mg | ORAL_TABLET | Freq: Every day | ORAL | 0 refills | Status: DC

## 2019-09-03 DIAGNOSIS — E785 Hyperlipidemia, unspecified: Secondary | ICD-10-CM | POA: Diagnosis not present

## 2019-09-03 DIAGNOSIS — F5101 Primary insomnia: Secondary | ICD-10-CM | POA: Diagnosis not present

## 2019-09-03 DIAGNOSIS — K5790 Diverticulosis of intestine, part unspecified, without perforation or abscess without bleeding: Secondary | ICD-10-CM | POA: Diagnosis not present

## 2019-09-03 DIAGNOSIS — M5441 Lumbago with sciatica, right side: Secondary | ICD-10-CM | POA: Diagnosis not present

## 2019-09-03 DIAGNOSIS — R634 Abnormal weight loss: Secondary | ICD-10-CM | POA: Diagnosis not present

## 2019-09-03 DIAGNOSIS — R351 Nocturia: Secondary | ICD-10-CM | POA: Diagnosis not present

## 2019-09-11 DIAGNOSIS — K5790 Diverticulosis of intestine, part unspecified, without perforation or abscess without bleeding: Secondary | ICD-10-CM | POA: Diagnosis not present

## 2019-09-11 DIAGNOSIS — F5101 Primary insomnia: Secondary | ICD-10-CM | POA: Diagnosis not present

## 2019-09-11 DIAGNOSIS — M5441 Lumbago with sciatica, right side: Secondary | ICD-10-CM | POA: Diagnosis not present

## 2019-09-11 DIAGNOSIS — R634 Abnormal weight loss: Secondary | ICD-10-CM | POA: Diagnosis not present

## 2019-09-11 DIAGNOSIS — R351 Nocturia: Secondary | ICD-10-CM | POA: Diagnosis not present

## 2019-09-11 DIAGNOSIS — E785 Hyperlipidemia, unspecified: Secondary | ICD-10-CM | POA: Diagnosis not present

## 2019-09-15 DIAGNOSIS — R634 Abnormal weight loss: Secondary | ICD-10-CM | POA: Diagnosis not present

## 2019-09-15 DIAGNOSIS — F5101 Primary insomnia: Secondary | ICD-10-CM | POA: Diagnosis not present

## 2019-09-15 DIAGNOSIS — R351 Nocturia: Secondary | ICD-10-CM | POA: Diagnosis not present

## 2019-09-15 DIAGNOSIS — M5441 Lumbago with sciatica, right side: Secondary | ICD-10-CM | POA: Diagnosis not present

## 2019-09-15 DIAGNOSIS — E785 Hyperlipidemia, unspecified: Secondary | ICD-10-CM | POA: Diagnosis not present

## 2019-09-15 DIAGNOSIS — K5790 Diverticulosis of intestine, part unspecified, without perforation or abscess without bleeding: Secondary | ICD-10-CM | POA: Diagnosis not present

## 2019-09-16 ENCOUNTER — Telehealth: Payer: Self-pay

## 2019-09-16 NOTE — Telephone Encounter (Signed)
Leda Gauze with [facility] called and LVM requesting verbal authorization for home PT once a week x 4 weeks. She states the pt is reporting a decrease in pain and that there is marked improvement in strength. PT was ordered by Dr. Darene Lamer on 08/14/2019. I spoke with Apolonio Schneiders who stated that since it was ordered by Dr. Darene Lamer that verbal authorization can be given. Called and gave verbal authorization to [place].

## 2019-09-16 NOTE — Telephone Encounter (Signed)
Absolutely! And Thank you.

## 2019-09-18 DIAGNOSIS — M5441 Lumbago with sciatica, right side: Secondary | ICD-10-CM | POA: Diagnosis not present

## 2019-09-18 DIAGNOSIS — E785 Hyperlipidemia, unspecified: Secondary | ICD-10-CM | POA: Diagnosis not present

## 2019-09-18 DIAGNOSIS — K5790 Diverticulosis of intestine, part unspecified, without perforation or abscess without bleeding: Secondary | ICD-10-CM | POA: Diagnosis not present

## 2019-09-18 DIAGNOSIS — R351 Nocturia: Secondary | ICD-10-CM | POA: Diagnosis not present

## 2019-09-18 DIAGNOSIS — F5101 Primary insomnia: Secondary | ICD-10-CM | POA: Diagnosis not present

## 2019-09-18 DIAGNOSIS — R634 Abnormal weight loss: Secondary | ICD-10-CM | POA: Diagnosis not present

## 2019-09-23 ENCOUNTER — Other Ambulatory Visit: Payer: Self-pay

## 2019-09-23 DIAGNOSIS — F5101 Primary insomnia: Secondary | ICD-10-CM | POA: Diagnosis not present

## 2019-09-23 DIAGNOSIS — R634 Abnormal weight loss: Secondary | ICD-10-CM | POA: Diagnosis not present

## 2019-09-23 DIAGNOSIS — M5441 Lumbago with sciatica, right side: Secondary | ICD-10-CM | POA: Diagnosis not present

## 2019-09-23 DIAGNOSIS — E785 Hyperlipidemia, unspecified: Secondary | ICD-10-CM | POA: Diagnosis not present

## 2019-09-23 DIAGNOSIS — K5790 Diverticulosis of intestine, part unspecified, without perforation or abscess without bleeding: Secondary | ICD-10-CM | POA: Diagnosis not present

## 2019-09-23 DIAGNOSIS — R351 Nocturia: Secondary | ICD-10-CM | POA: Diagnosis not present

## 2019-09-23 MED ORDER — AMLODIPINE BESYLATE 10 MG PO TABS: 10.0000 mg | ORAL_TABLET | Freq: Every day | ORAL | 0 refills | Status: AC

## 2019-09-23 MED ORDER — METOPROLOL TARTRATE 50 MG PO TABS: 50.0000 mg | ORAL_TABLET | Freq: Two times a day (BID) | ORAL | 0 refills | Status: AC

## 2019-09-24 ENCOUNTER — Ambulatory Visit: Payer: Medicare Other | Admitting: Sports Medicine

## 2019-09-25 ENCOUNTER — Telehealth: Payer: Self-pay

## 2019-09-25 NOTE — Telephone Encounter (Signed)
Spoke to pt's daughter Horris Latino earlier today. Pt and his wife has moved to [facility]. Unsure whether they will continue care with Dr. Madilyn Fireman. Requesting their medical records. New address updated in pt's chart.

## 2019-09-26 NOTE — Telephone Encounter (Signed)
Patient advised to get new office to send medical record release form. No further questions.

## 2019-10-22 DIAGNOSIS — H6123 Impacted cerumen, bilateral: Secondary | ICD-10-CM | POA: Diagnosis not present

## 2019-10-22 DIAGNOSIS — R0982 Postnasal drip: Secondary | ICD-10-CM | POA: Diagnosis not present

## 2019-11-01 DIAGNOSIS — H401120 Primary open-angle glaucoma, left eye, stage unspecified: Secondary | ICD-10-CM | POA: Diagnosis not present

## 2019-11-01 DIAGNOSIS — H353220 Exudative age-related macular degeneration, left eye, stage unspecified: Secondary | ICD-10-CM | POA: Diagnosis not present

## 2019-11-01 DIAGNOSIS — H353110 Nonexudative age-related macular degeneration, right eye, stage unspecified: Secondary | ICD-10-CM | POA: Diagnosis not present

## 2019-11-07 DIAGNOSIS — Z23 Encounter for immunization: Secondary | ICD-10-CM | POA: Diagnosis not present

## 2019-11-21 DIAGNOSIS — H353212 Exudative age-related macular degeneration, right eye, with inactive choroidal neovascularization: Secondary | ICD-10-CM | POA: Diagnosis not present

## 2019-11-21 DIAGNOSIS — H43813 Vitreous degeneration, bilateral: Secondary | ICD-10-CM | POA: Diagnosis not present

## 2019-11-21 DIAGNOSIS — H353221 Exudative age-related macular degeneration, left eye, with active choroidal neovascularization: Secondary | ICD-10-CM | POA: Diagnosis not present

## 2019-12-11 DIAGNOSIS — H40001 Preglaucoma, unspecified, right eye: Secondary | ICD-10-CM | POA: Diagnosis not present

## 2019-12-11 DIAGNOSIS — H401123 Primary open-angle glaucoma, left eye, severe stage: Secondary | ICD-10-CM | POA: Diagnosis not present

## 2020-01-09 DIAGNOSIS — K5900 Constipation, unspecified: Secondary | ICD-10-CM | POA: Diagnosis not present

## 2020-01-09 DIAGNOSIS — M1990 Unspecified osteoarthritis, unspecified site: Secondary | ICD-10-CM | POA: Diagnosis not present

## 2020-01-09 DIAGNOSIS — M8949 Other hypertrophic osteoarthropathy, multiple sites: Secondary | ICD-10-CM | POA: Diagnosis not present

## 2020-01-09 DIAGNOSIS — Z780 Asymptomatic menopausal state: Secondary | ICD-10-CM | POA: Diagnosis not present

## 2020-01-09 DIAGNOSIS — I10 Essential (primary) hypertension: Secondary | ICD-10-CM | POA: Diagnosis not present

## 2020-01-09 DIAGNOSIS — R634 Abnormal weight loss: Secondary | ICD-10-CM | POA: Diagnosis not present

## 2020-01-13 IMAGING — DX DG HAND COMPLETE 3+V*R*
3 series · 3 of 3 positions shown · non-contrast
Comparison: Radiograph 06/24/2014

CLINICAL DATA: Primary osteoarthritis of first carpal metacarpal
joint of right hand. Pain at the [facility].

EXAM:
RIGHT HAND - COMPLETE 3+ VIEW

[hand pa]
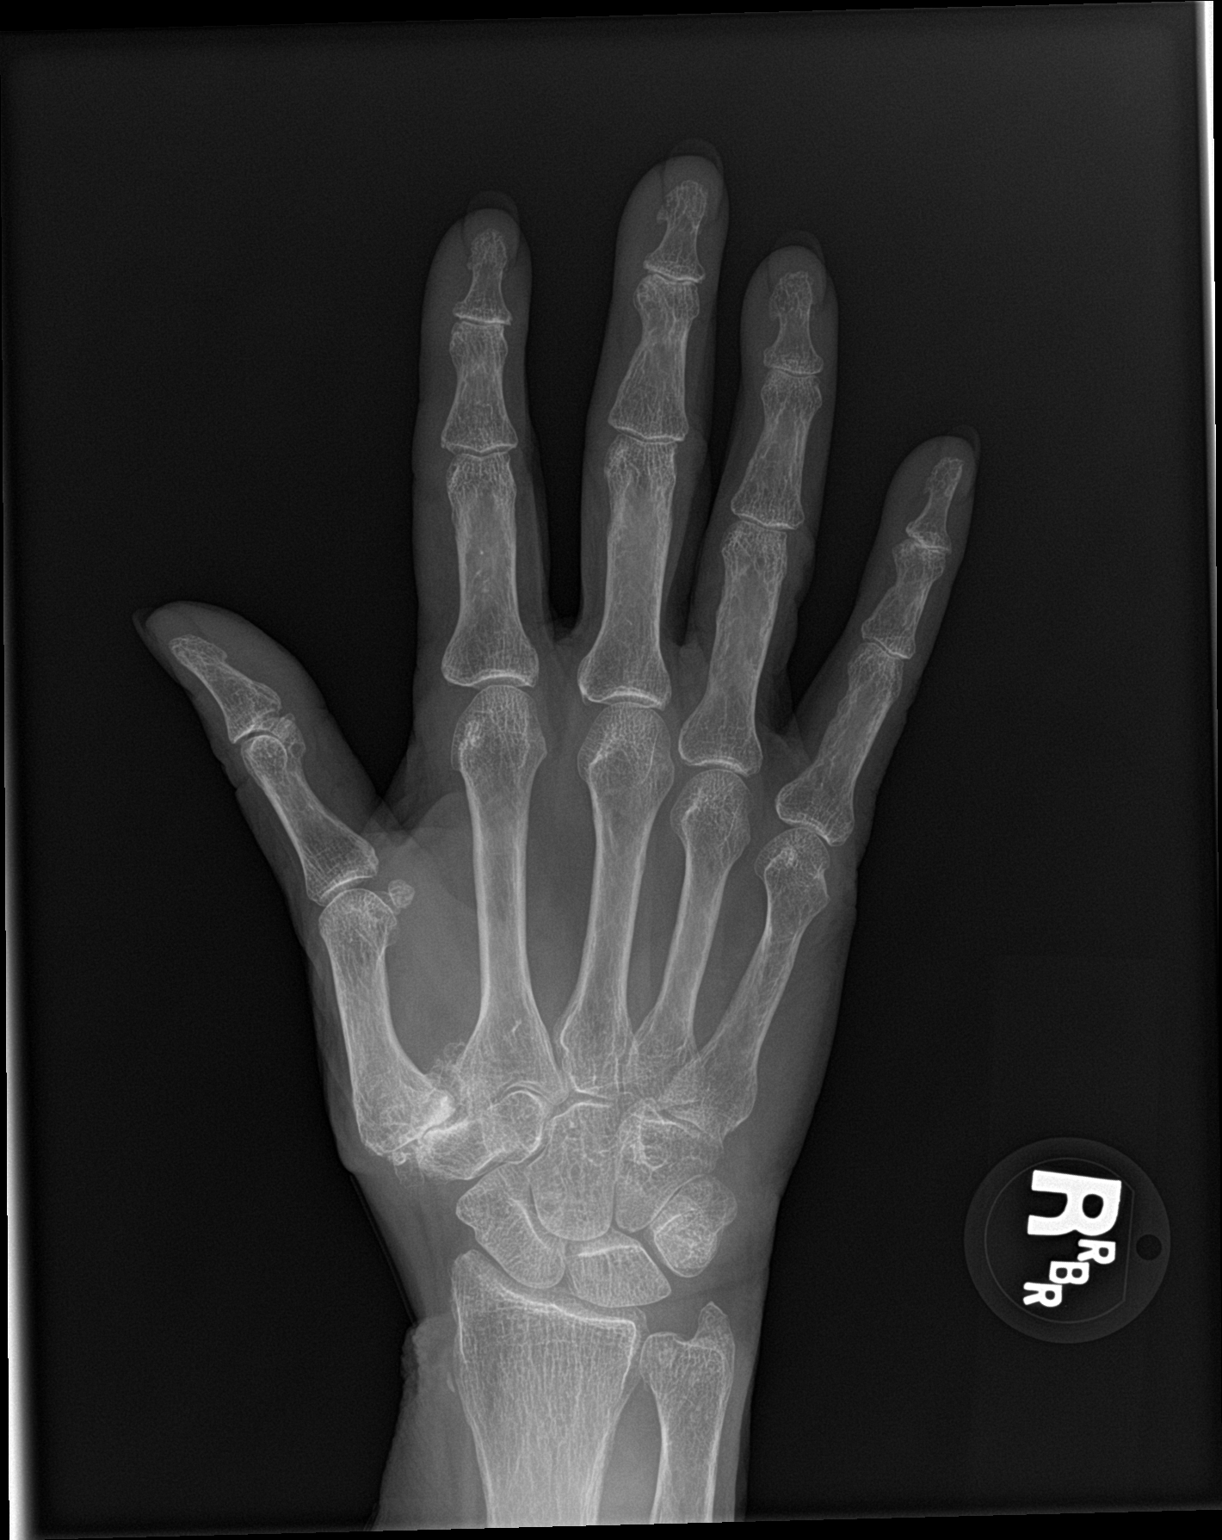

[hand obl]
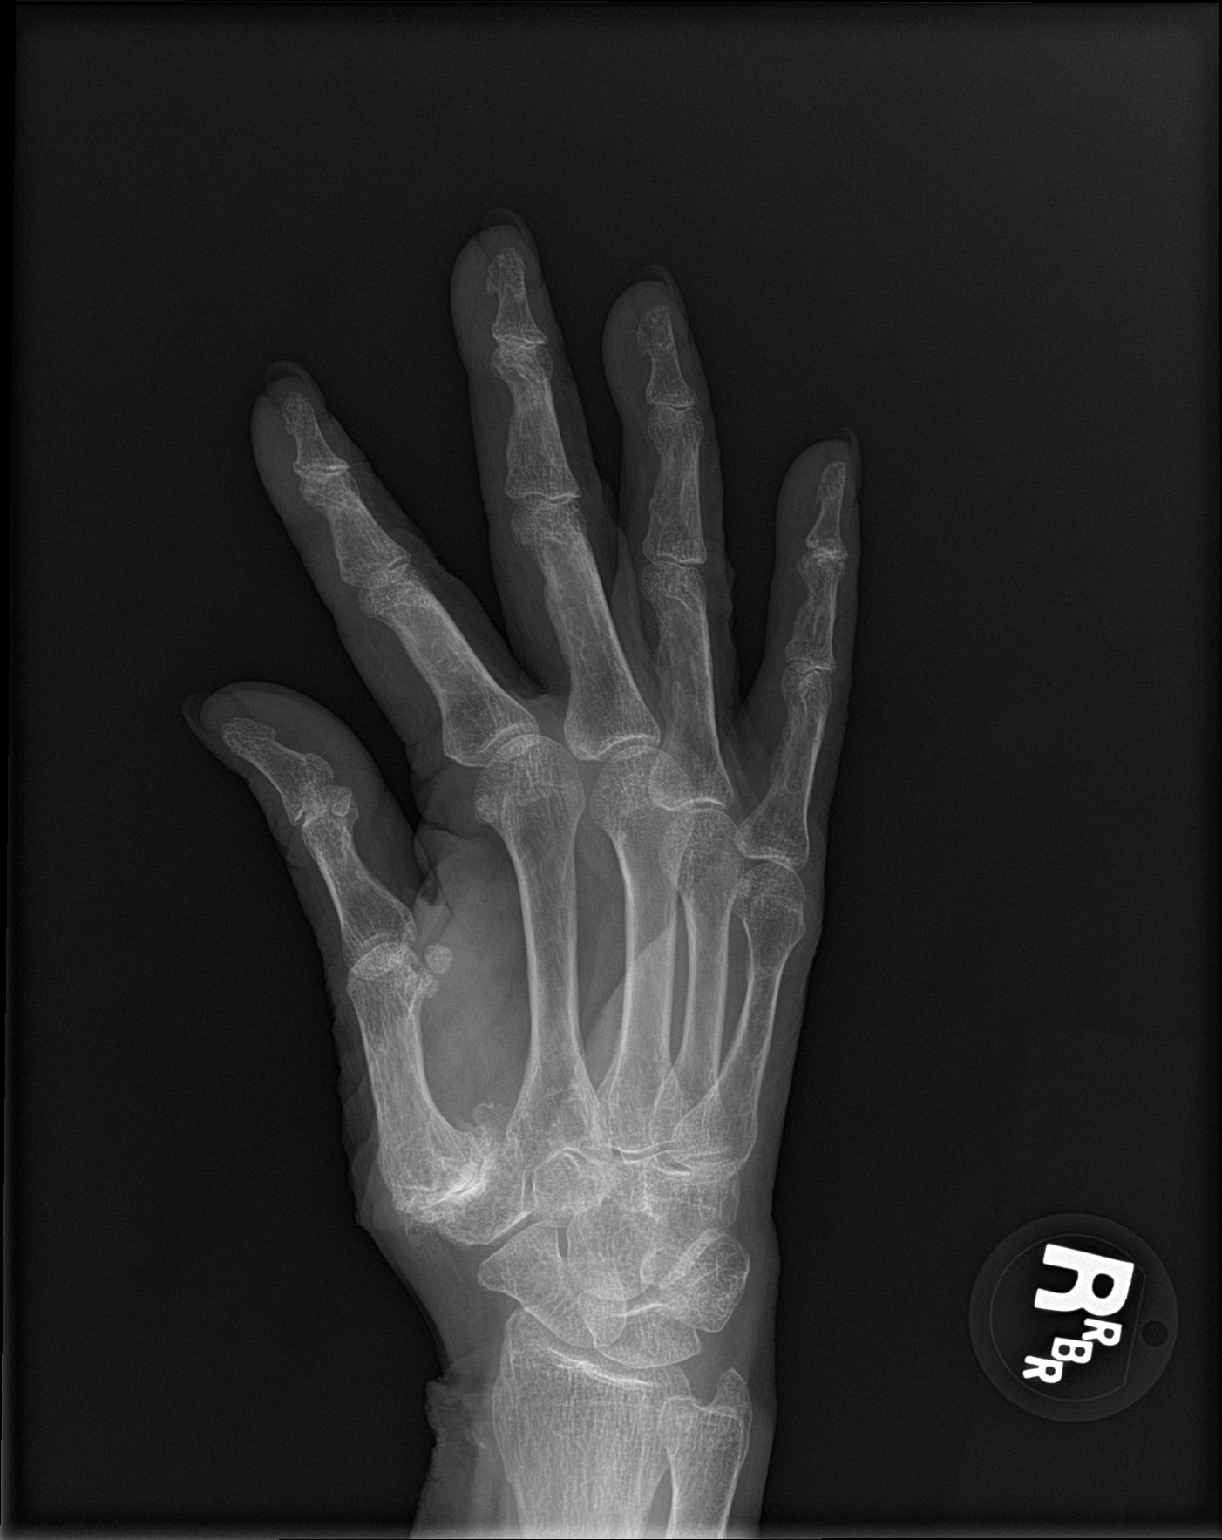

[hand lat]
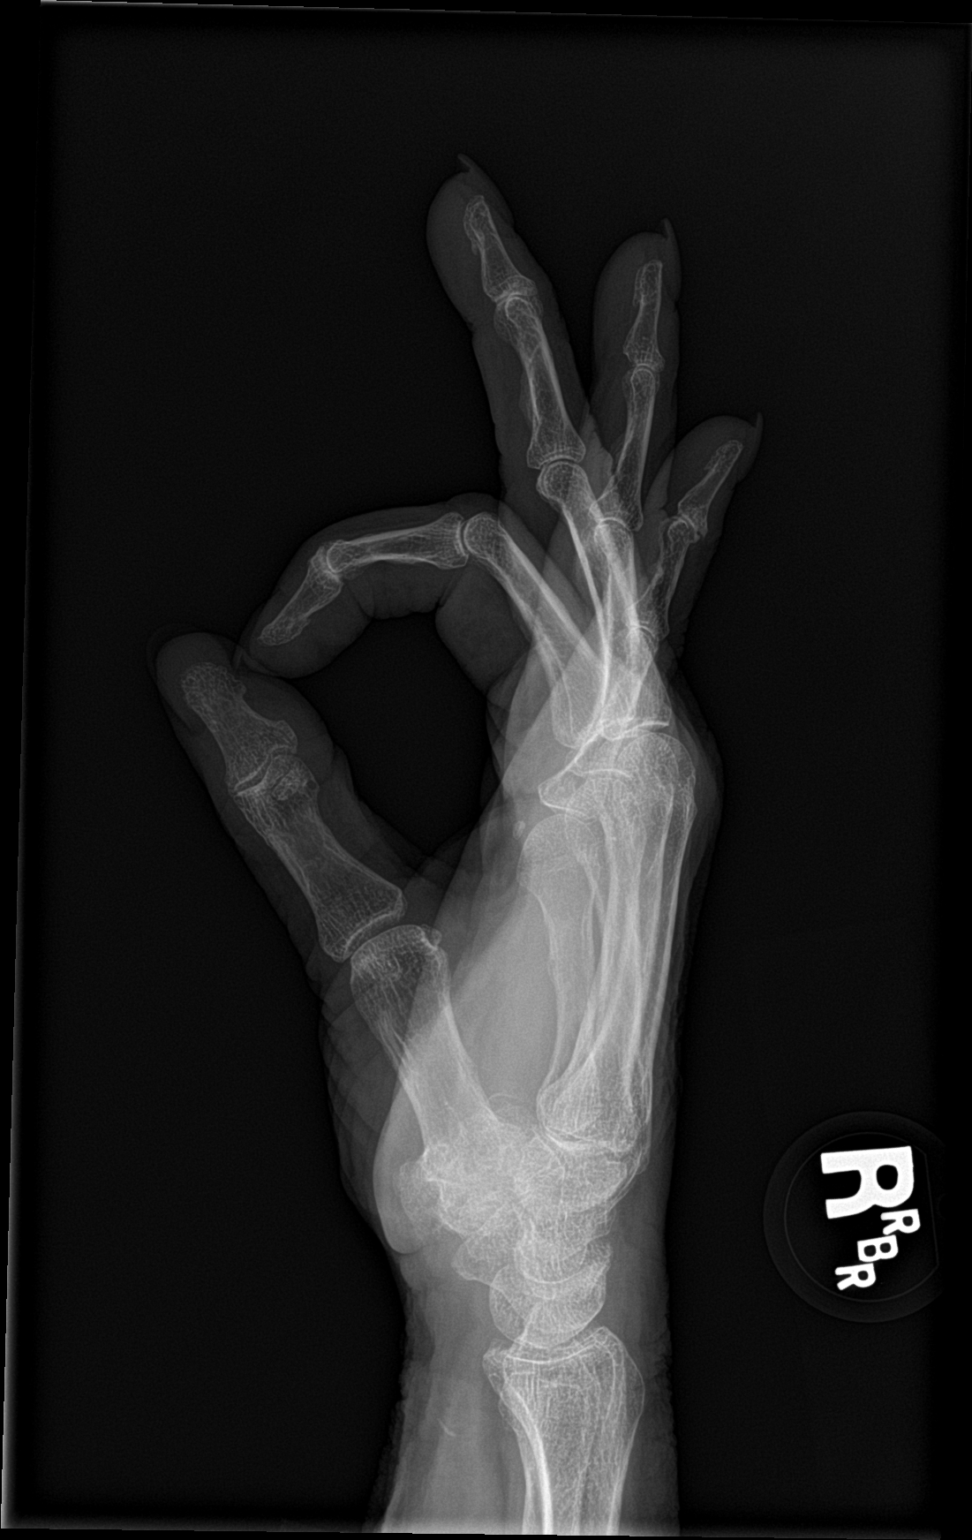

[3 of 3 positions shown; findings below may reference images not displayed]

FINDINGS: Advanced osteoarthritis of the thumb carpal metacarpal joint with
joint space loss, subchondral sclerosis and proliferative change.
Findings have not significantly changed from prior exam. Mild
osteoarthritis of the distal interphalangeal joints of the digits.
No evidence of inflammatory arthropathy or bony destructive change.
No focal soft tissue abnormality.
IMPRESSION: Advanced osteoarthritis of the thumb carpal metacarpal joint,
similar to 6770 radiograph. No new or acute abnormality.

## 2020-02-13 DIAGNOSIS — H353212 Exudative age-related macular degeneration, right eye, with inactive choroidal neovascularization: Secondary | ICD-10-CM | POA: Diagnosis not present

## 2020-02-13 DIAGNOSIS — H353221 Exudative age-related macular degeneration, left eye, with active choroidal neovascularization: Secondary | ICD-10-CM | POA: Diagnosis not present

## 2020-03-16 ENCOUNTER — Ambulatory Visit: Payer: Medicare Other

## 2020-03-23 DIAGNOSIS — M1990 Unspecified osteoarthritis, unspecified site: Secondary | ICD-10-CM | POA: Diagnosis not present

## 2020-03-23 DIAGNOSIS — M5459 Other low back pain: Secondary | ICD-10-CM | POA: Diagnosis not present

## 2020-03-25 DIAGNOSIS — M5459 Other low back pain: Secondary | ICD-10-CM | POA: Diagnosis not present

## 2020-03-25 DIAGNOSIS — M1990 Unspecified osteoarthritis, unspecified site: Secondary | ICD-10-CM | POA: Diagnosis not present

## 2020-03-27 DIAGNOSIS — M1990 Unspecified osteoarthritis, unspecified site: Secondary | ICD-10-CM | POA: Diagnosis not present

## 2020-03-27 DIAGNOSIS — M5459 Other low back pain: Secondary | ICD-10-CM | POA: Diagnosis not present

## 2020-04-01 DIAGNOSIS — M1990 Unspecified osteoarthritis, unspecified site: Secondary | ICD-10-CM | POA: Diagnosis not present

## 2020-04-01 DIAGNOSIS — M5459 Other low back pain: Secondary | ICD-10-CM | POA: Diagnosis not present

## 2020-04-03 DIAGNOSIS — M1990 Unspecified osteoarthritis, unspecified site: Secondary | ICD-10-CM | POA: Diagnosis not present

## 2020-04-03 DIAGNOSIS — M5459 Other low back pain: Secondary | ICD-10-CM | POA: Diagnosis not present

## 2020-04-06 DIAGNOSIS — M1990 Unspecified osteoarthritis, unspecified site: Secondary | ICD-10-CM | POA: Diagnosis not present

## 2020-04-06 DIAGNOSIS — M5459 Other low back pain: Secondary | ICD-10-CM | POA: Diagnosis not present

## 2020-04-08 DIAGNOSIS — M5459 Other low back pain: Secondary | ICD-10-CM | POA: Diagnosis not present

## 2020-04-08 DIAGNOSIS — M1990 Unspecified osteoarthritis, unspecified site: Secondary | ICD-10-CM | POA: Diagnosis not present

## 2020-04-16 DIAGNOSIS — H40001 Preglaucoma, unspecified, right eye: Secondary | ICD-10-CM | POA: Diagnosis not present

## 2020-04-16 DIAGNOSIS — H401123 Primary open-angle glaucoma, left eye, severe stage: Secondary | ICD-10-CM | POA: Diagnosis not present

## 2020-04-23 DIAGNOSIS — I10 Essential (primary) hypertension: Secondary | ICD-10-CM | POA: Diagnosis not present

## 2020-04-23 DIAGNOSIS — N952 Postmenopausal atrophic vaginitis: Secondary | ICD-10-CM | POA: Diagnosis not present

## 2020-04-23 DIAGNOSIS — M8949 Other hypertrophic osteoarthropathy, multiple sites: Secondary | ICD-10-CM | POA: Diagnosis not present

## 2020-04-23 DIAGNOSIS — J301 Allergic rhinitis due to pollen: Secondary | ICD-10-CM | POA: Diagnosis not present

## 2020-04-23 DIAGNOSIS — M545 Low back pain, unspecified: Secondary | ICD-10-CM | POA: Diagnosis not present

## 2020-04-23 DIAGNOSIS — R634 Abnormal weight loss: Secondary | ICD-10-CM | POA: Diagnosis not present

## 2020-04-23 DIAGNOSIS — L989 Disorder of the skin and subcutaneous tissue, unspecified: Secondary | ICD-10-CM | POA: Diagnosis not present

## 2020-04-27 DIAGNOSIS — M1990 Unspecified osteoarthritis, unspecified site: Secondary | ICD-10-CM | POA: Diagnosis not present

## 2020-04-27 DIAGNOSIS — M5459 Other low back pain: Secondary | ICD-10-CM | POA: Diagnosis not present

## 2020-04-29 DIAGNOSIS — M1990 Unspecified osteoarthritis, unspecified site: Secondary | ICD-10-CM | POA: Diagnosis not present

## 2020-04-29 DIAGNOSIS — M5459 Other low back pain: Secondary | ICD-10-CM | POA: Diagnosis not present

## 2020-05-04 DIAGNOSIS — M1990 Unspecified osteoarthritis, unspecified site: Secondary | ICD-10-CM | POA: Diagnosis not present

## 2020-05-04 DIAGNOSIS — M5459 Other low back pain: Secondary | ICD-10-CM | POA: Diagnosis not present

## 2020-05-08 DIAGNOSIS — M1990 Unspecified osteoarthritis, unspecified site: Secondary | ICD-10-CM | POA: Diagnosis not present

## 2020-05-08 DIAGNOSIS — M5459 Other low back pain: Secondary | ICD-10-CM | POA: Diagnosis not present

## 2020-05-11 DIAGNOSIS — M5459 Other low back pain: Secondary | ICD-10-CM | POA: Diagnosis not present

## 2020-05-11 DIAGNOSIS — M1990 Unspecified osteoarthritis, unspecified site: Secondary | ICD-10-CM | POA: Diagnosis not present

## 2020-05-13 DIAGNOSIS — M5459 Other low back pain: Secondary | ICD-10-CM | POA: Diagnosis not present

## 2020-05-13 DIAGNOSIS — M1990 Unspecified osteoarthritis, unspecified site: Secondary | ICD-10-CM | POA: Diagnosis not present

## 2020-05-18 DIAGNOSIS — M1990 Unspecified osteoarthritis, unspecified site: Secondary | ICD-10-CM | POA: Diagnosis not present

## 2020-05-18 DIAGNOSIS — M5459 Other low back pain: Secondary | ICD-10-CM | POA: Diagnosis not present

## 2020-05-20 DIAGNOSIS — M1990 Unspecified osteoarthritis, unspecified site: Secondary | ICD-10-CM | POA: Diagnosis not present

## 2020-05-20 DIAGNOSIS — M5459 Other low back pain: Secondary | ICD-10-CM | POA: Diagnosis not present

## 2020-05-21 DIAGNOSIS — H353221 Exudative age-related macular degeneration, left eye, with active choroidal neovascularization: Secondary | ICD-10-CM | POA: Diagnosis not present

## 2020-05-21 DIAGNOSIS — H353212 Exudative age-related macular degeneration, right eye, with inactive choroidal neovascularization: Secondary | ICD-10-CM | POA: Diagnosis not present

## 2020-05-25 DIAGNOSIS — M5459 Other low back pain: Secondary | ICD-10-CM | POA: Diagnosis not present

## 2020-05-25 DIAGNOSIS — M1990 Unspecified osteoarthritis, unspecified site: Secondary | ICD-10-CM | POA: Diagnosis not present

## 2020-05-27 DIAGNOSIS — M1990 Unspecified osteoarthritis, unspecified site: Secondary | ICD-10-CM | POA: Diagnosis not present

## 2020-05-27 DIAGNOSIS — M5459 Other low back pain: Secondary | ICD-10-CM | POA: Diagnosis not present

## 2020-05-28 DIAGNOSIS — Z23 Encounter for immunization: Secondary | ICD-10-CM | POA: Diagnosis not present

## 2020-06-01 DIAGNOSIS — M1990 Unspecified osteoarthritis, unspecified site: Secondary | ICD-10-CM | POA: Diagnosis not present

## 2020-06-01 DIAGNOSIS — M5459 Other low back pain: Secondary | ICD-10-CM | POA: Diagnosis not present

## 2020-06-10 DIAGNOSIS — M5459 Other low back pain: Secondary | ICD-10-CM | POA: Diagnosis not present

## 2020-06-10 DIAGNOSIS — M1990 Unspecified osteoarthritis, unspecified site: Secondary | ICD-10-CM | POA: Diagnosis not present

## 2020-06-24 DIAGNOSIS — D492 Neoplasm of unspecified behavior of bone, soft tissue, and skin: Secondary | ICD-10-CM | POA: Diagnosis not present

## 2020-06-24 DIAGNOSIS — L814 Other melanin hyperpigmentation: Secondary | ICD-10-CM | POA: Diagnosis not present

## 2020-06-24 DIAGNOSIS — D1801 Hemangioma of skin and subcutaneous tissue: Secondary | ICD-10-CM | POA: Diagnosis not present

## 2020-06-24 DIAGNOSIS — L438 Other lichen planus: Secondary | ICD-10-CM | POA: Diagnosis not present

## 2020-06-24 DIAGNOSIS — L821 Other seborrheic keratosis: Secondary | ICD-10-CM | POA: Diagnosis not present

## 2020-07-06 DIAGNOSIS — M1711 Unilateral primary osteoarthritis, right knee: Secondary | ICD-10-CM | POA: Diagnosis not present

## 2020-07-06 DIAGNOSIS — M25761 Osteophyte, right knee: Secondary | ICD-10-CM | POA: Diagnosis not present

## 2020-07-06 DIAGNOSIS — M160 Bilateral primary osteoarthritis of hip: Secondary | ICD-10-CM | POA: Diagnosis not present

## 2020-07-06 DIAGNOSIS — M25762 Osteophyte, left knee: Secondary | ICD-10-CM | POA: Diagnosis not present

## 2020-07-06 DIAGNOSIS — M85861 Other specified disorders of bone density and structure, right lower leg: Secondary | ICD-10-CM | POA: Diagnosis not present

## 2020-07-06 DIAGNOSIS — M25561 Pain in right knee: Secondary | ICD-10-CM | POA: Diagnosis not present

## 2020-07-06 DIAGNOSIS — M85862 Other specified disorders of bone density and structure, left lower leg: Secondary | ICD-10-CM | POA: Diagnosis not present

## 2020-07-06 DIAGNOSIS — M5416 Radiculopathy, lumbar region: Secondary | ICD-10-CM | POA: Diagnosis not present

## 2020-07-06 DIAGNOSIS — M1611 Unilateral primary osteoarthritis, right hip: Secondary | ICD-10-CM | POA: Diagnosis not present

## 2020-07-06 DIAGNOSIS — M5136 Other intervertebral disc degeneration, lumbar region: Secondary | ICD-10-CM | POA: Diagnosis not present

## 2020-07-06 DIAGNOSIS — M4856XA Collapsed vertebra, not elsewhere classified, lumbar region, initial encounter for fracture: Secondary | ICD-10-CM | POA: Diagnosis not present

## 2020-07-06 DIAGNOSIS — S32010A Wedge compression fracture of first lumbar vertebra, initial encounter for closed fracture: Secondary | ICD-10-CM | POA: Diagnosis not present

## 2020-07-23 DIAGNOSIS — R2681 Unsteadiness on feet: Secondary | ICD-10-CM | POA: Diagnosis not present

## 2020-07-23 DIAGNOSIS — M5416 Radiculopathy, lumbar region: Secondary | ICD-10-CM | POA: Diagnosis not present

## 2020-07-23 DIAGNOSIS — M1711 Unilateral primary osteoarthritis, right knee: Secondary | ICD-10-CM | POA: Diagnosis not present

## 2020-07-23 DIAGNOSIS — M5459 Other low back pain: Secondary | ICD-10-CM | POA: Diagnosis not present

## 2020-07-24 DIAGNOSIS — M5459 Other low back pain: Secondary | ICD-10-CM | POA: Diagnosis not present

## 2020-07-24 DIAGNOSIS — M5416 Radiculopathy, lumbar region: Secondary | ICD-10-CM | POA: Diagnosis not present

## 2020-07-24 DIAGNOSIS — R2681 Unsteadiness on feet: Secondary | ICD-10-CM | POA: Diagnosis not present

## 2020-07-24 DIAGNOSIS — M1711 Unilateral primary osteoarthritis, right knee: Secondary | ICD-10-CM | POA: Diagnosis not present

## 2020-07-28 DIAGNOSIS — M5459 Other low back pain: Secondary | ICD-10-CM | POA: Diagnosis not present

## 2020-07-28 DIAGNOSIS — M1711 Unilateral primary osteoarthritis, right knee: Secondary | ICD-10-CM | POA: Diagnosis not present

## 2020-07-28 DIAGNOSIS — R2681 Unsteadiness on feet: Secondary | ICD-10-CM | POA: Diagnosis not present

## 2020-07-28 DIAGNOSIS — M5416 Radiculopathy, lumbar region: Secondary | ICD-10-CM | POA: Diagnosis not present

## 2020-07-31 DIAGNOSIS — M5416 Radiculopathy, lumbar region: Secondary | ICD-10-CM | POA: Diagnosis not present

## 2020-07-31 DIAGNOSIS — M5459 Other low back pain: Secondary | ICD-10-CM | POA: Diagnosis not present

## 2020-07-31 DIAGNOSIS — R2681 Unsteadiness on feet: Secondary | ICD-10-CM | POA: Diagnosis not present

## 2020-07-31 DIAGNOSIS — M1711 Unilateral primary osteoarthritis, right knee: Secondary | ICD-10-CM | POA: Diagnosis not present

## 2020-08-04 DIAGNOSIS — M1711 Unilateral primary osteoarthritis, right knee: Secondary | ICD-10-CM | POA: Diagnosis not present

## 2020-08-04 DIAGNOSIS — M5459 Other low back pain: Secondary | ICD-10-CM | POA: Diagnosis not present

## 2020-08-04 DIAGNOSIS — R2681 Unsteadiness on feet: Secondary | ICD-10-CM | POA: Diagnosis not present

## 2020-08-04 DIAGNOSIS — M5416 Radiculopathy, lumbar region: Secondary | ICD-10-CM | POA: Diagnosis not present

## 2020-08-06 DIAGNOSIS — R2681 Unsteadiness on feet: Secondary | ICD-10-CM | POA: Diagnosis not present

## 2020-08-06 DIAGNOSIS — M5459 Other low back pain: Secondary | ICD-10-CM | POA: Diagnosis not present

## 2020-08-06 DIAGNOSIS — M1711 Unilateral primary osteoarthritis, right knee: Secondary | ICD-10-CM | POA: Diagnosis not present

## 2020-08-06 DIAGNOSIS — M5416 Radiculopathy, lumbar region: Secondary | ICD-10-CM | POA: Diagnosis not present

## 2020-08-11 DIAGNOSIS — M1711 Unilateral primary osteoarthritis, right knee: Secondary | ICD-10-CM | POA: Diagnosis not present

## 2020-08-11 DIAGNOSIS — M5416 Radiculopathy, lumbar region: Secondary | ICD-10-CM | POA: Diagnosis not present

## 2020-08-11 DIAGNOSIS — M5459 Other low back pain: Secondary | ICD-10-CM | POA: Diagnosis not present

## 2020-08-11 DIAGNOSIS — R2681 Unsteadiness on feet: Secondary | ICD-10-CM | POA: Diagnosis not present

## 2020-08-13 DIAGNOSIS — M5416 Radiculopathy, lumbar region: Secondary | ICD-10-CM | POA: Diagnosis not present

## 2020-08-13 DIAGNOSIS — M5459 Other low back pain: Secondary | ICD-10-CM | POA: Diagnosis not present

## 2020-08-13 DIAGNOSIS — R2681 Unsteadiness on feet: Secondary | ICD-10-CM | POA: Diagnosis not present

## 2020-08-13 DIAGNOSIS — M1711 Unilateral primary osteoarthritis, right knee: Secondary | ICD-10-CM | POA: Diagnosis not present

## 2020-08-18 DIAGNOSIS — M5416 Radiculopathy, lumbar region: Secondary | ICD-10-CM | POA: Diagnosis not present

## 2020-08-18 DIAGNOSIS — M5459 Other low back pain: Secondary | ICD-10-CM | POA: Diagnosis not present

## 2020-08-18 DIAGNOSIS — M1711 Unilateral primary osteoarthritis, right knee: Secondary | ICD-10-CM | POA: Diagnosis not present

## 2020-08-18 DIAGNOSIS — R2681 Unsteadiness on feet: Secondary | ICD-10-CM | POA: Diagnosis not present

## 2020-08-20 DIAGNOSIS — H353212 Exudative age-related macular degeneration, right eye, with inactive choroidal neovascularization: Secondary | ICD-10-CM | POA: Diagnosis not present

## 2020-08-20 DIAGNOSIS — H353221 Exudative age-related macular degeneration, left eye, with active choroidal neovascularization: Secondary | ICD-10-CM | POA: Diagnosis not present

## 2020-08-25 DIAGNOSIS — M5459 Other low back pain: Secondary | ICD-10-CM | POA: Diagnosis not present

## 2020-08-25 DIAGNOSIS — M5416 Radiculopathy, lumbar region: Secondary | ICD-10-CM | POA: Diagnosis not present

## 2020-08-25 DIAGNOSIS — M1711 Unilateral primary osteoarthritis, right knee: Secondary | ICD-10-CM | POA: Diagnosis not present

## 2020-08-25 DIAGNOSIS — R2681 Unsteadiness on feet: Secondary | ICD-10-CM | POA: Diagnosis not present

## 2020-08-27 DIAGNOSIS — M48061 Spinal stenosis, lumbar region without neurogenic claudication: Secondary | ICD-10-CM | POA: Diagnosis not present

## 2020-08-27 DIAGNOSIS — M1711 Unilateral primary osteoarthritis, right knee: Secondary | ICD-10-CM | POA: Diagnosis not present

## 2020-08-31 DIAGNOSIS — M5416 Radiculopathy, lumbar region: Secondary | ICD-10-CM | POA: Diagnosis not present

## 2020-08-31 DIAGNOSIS — M1711 Unilateral primary osteoarthritis, right knee: Secondary | ICD-10-CM | POA: Diagnosis not present

## 2020-08-31 DIAGNOSIS — M5459 Other low back pain: Secondary | ICD-10-CM | POA: Diagnosis not present

## 2020-08-31 DIAGNOSIS — R2681 Unsteadiness on feet: Secondary | ICD-10-CM | POA: Diagnosis not present

## 2020-09-04 DIAGNOSIS — R2681 Unsteadiness on feet: Secondary | ICD-10-CM | POA: Diagnosis not present

## 2020-09-04 DIAGNOSIS — M5459 Other low back pain: Secondary | ICD-10-CM | POA: Diagnosis not present

## 2020-09-04 DIAGNOSIS — M1711 Unilateral primary osteoarthritis, right knee: Secondary | ICD-10-CM | POA: Diagnosis not present

## 2020-09-04 DIAGNOSIS — M5416 Radiculopathy, lumbar region: Secondary | ICD-10-CM | POA: Diagnosis not present

## 2020-09-08 DIAGNOSIS — M5459 Other low back pain: Secondary | ICD-10-CM | POA: Diagnosis not present

## 2020-09-08 DIAGNOSIS — R2681 Unsteadiness on feet: Secondary | ICD-10-CM | POA: Diagnosis not present

## 2020-09-08 DIAGNOSIS — M1711 Unilateral primary osteoarthritis, right knee: Secondary | ICD-10-CM | POA: Diagnosis not present

## 2020-09-08 DIAGNOSIS — M5416 Radiculopathy, lumbar region: Secondary | ICD-10-CM | POA: Diagnosis not present

## 2020-09-10 DIAGNOSIS — M1711 Unilateral primary osteoarthritis, right knee: Secondary | ICD-10-CM | POA: Diagnosis not present

## 2020-09-10 DIAGNOSIS — M5459 Other low back pain: Secondary | ICD-10-CM | POA: Diagnosis not present

## 2020-09-10 DIAGNOSIS — R2681 Unsteadiness on feet: Secondary | ICD-10-CM | POA: Diagnosis not present

## 2020-09-10 DIAGNOSIS — M5416 Radiculopathy, lumbar region: Secondary | ICD-10-CM | POA: Diagnosis not present

## 2020-09-16 IMAGING — DX DG THORACIC SPINE 3V
3 series · 3 of 3 positions shown · non-contrast
Comparison: None

CLINICAL DATA: Mid back pain for few months, no injury, history
hypertension, GERD

EXAM:
THORACIC SPINE - 3 VIEWS

[t-spine ap]
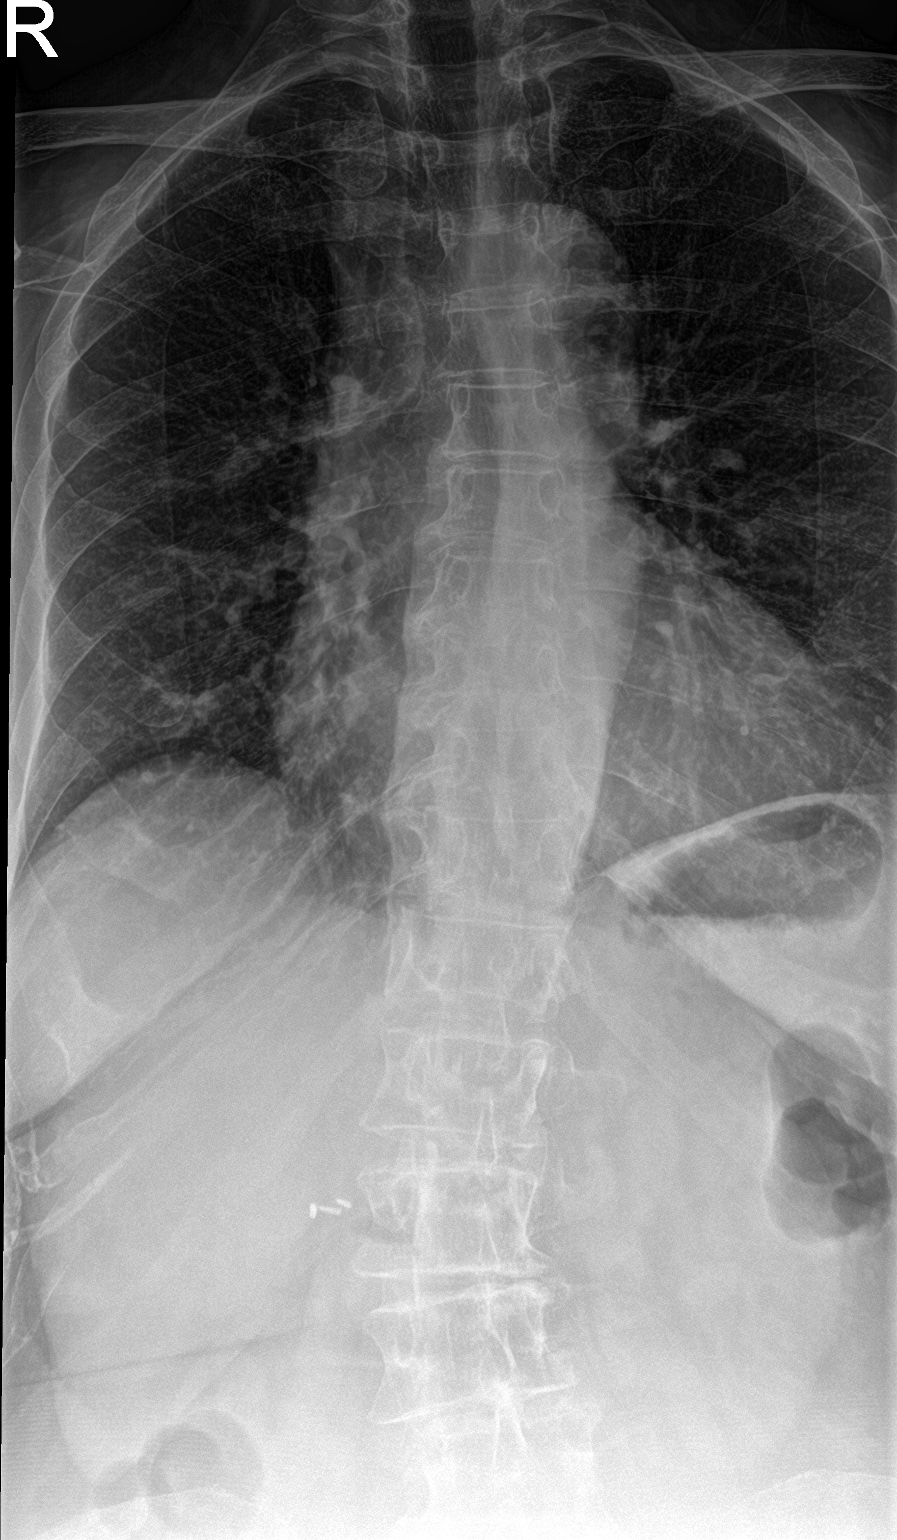

[t-spine lat]
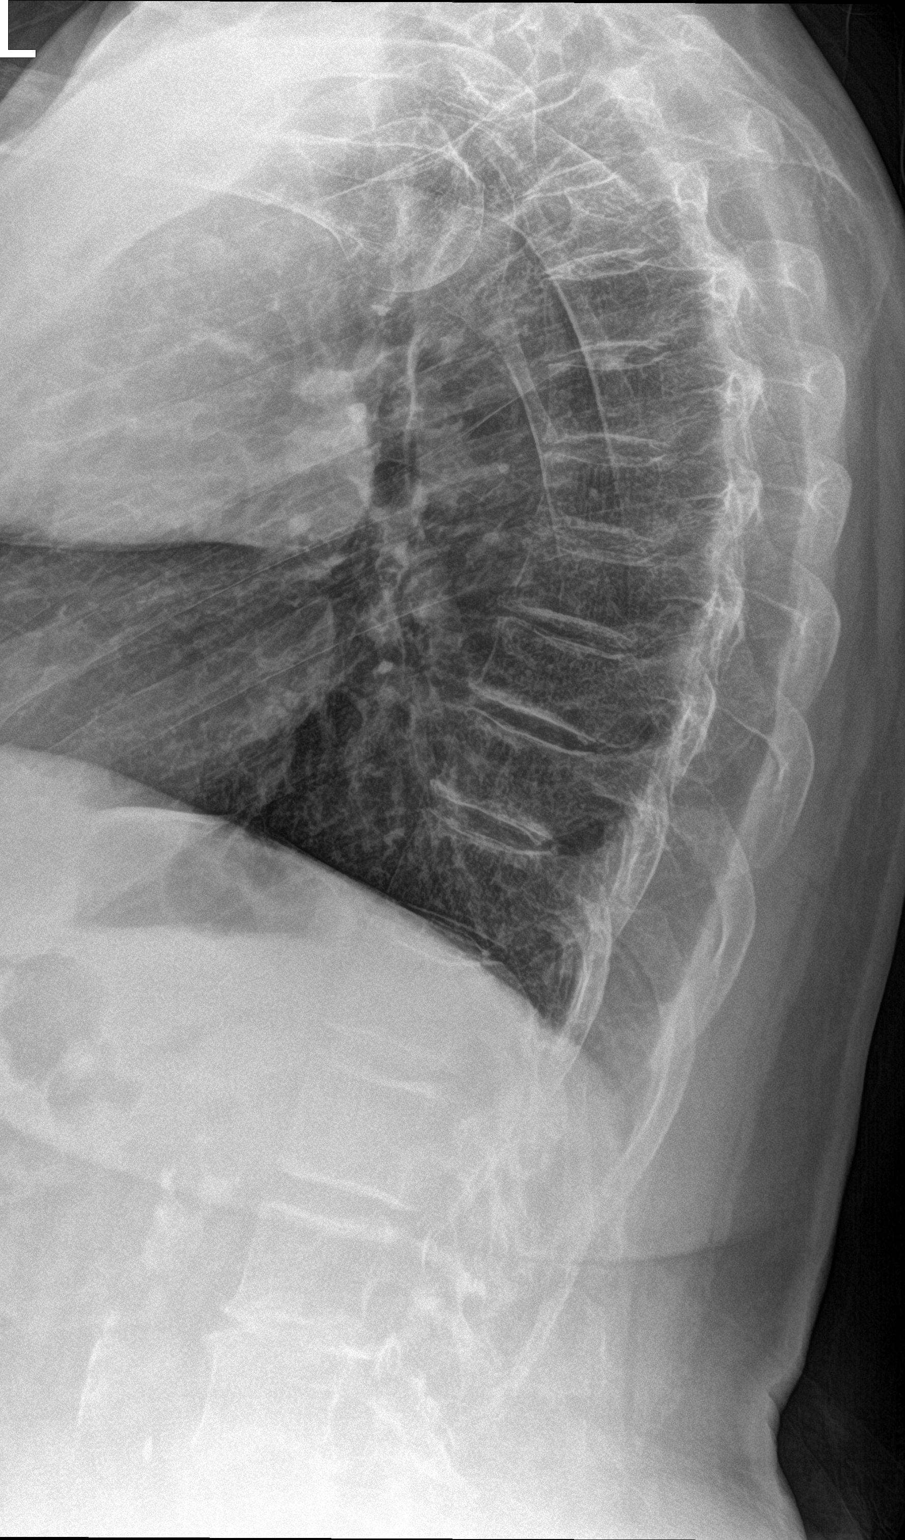

[t-spine swimmers]
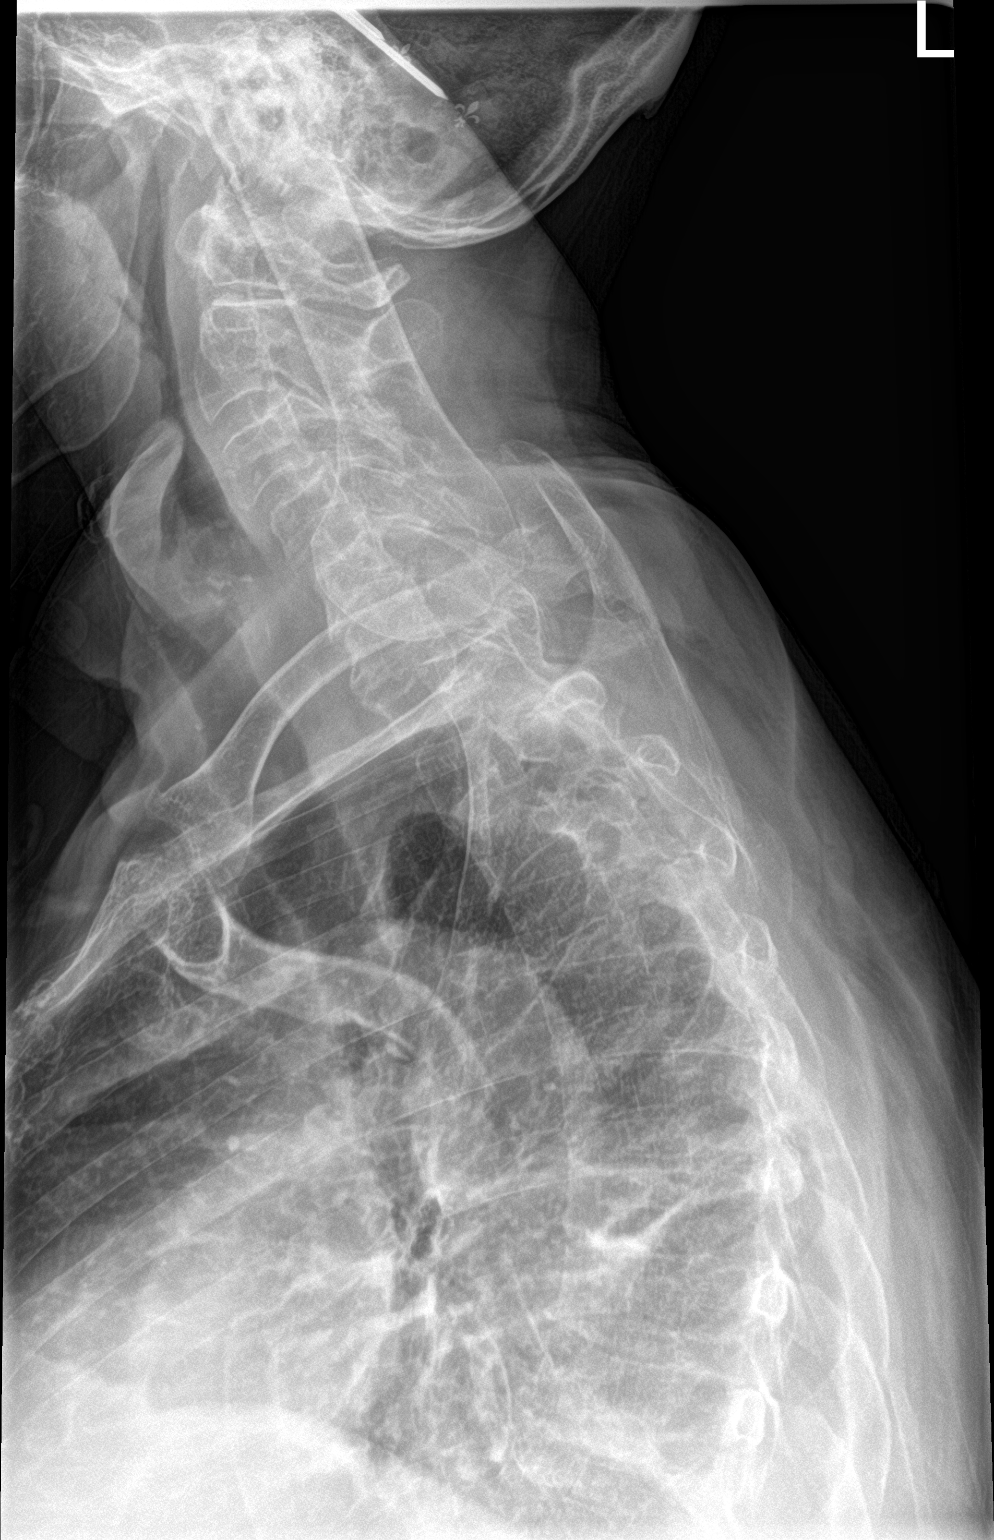

[3 of 3 positions shown; findings below may reference images not displayed]

FINDINGS: Diffuse osseous demineralization.

Twelve pairs of ribs.

Biconvex thoracolumbar scoliosis.

Vertebral body heights maintained without fracture or subluxation.

Scattered endplate spur formation and minimal disc space narrowing.

Visualized posterior ribs unremarkable.
IMPRESSION: Osseous demineralization with biconvex thoracolumbar scoliosis and
mild scattered degenerative disc disease changes.

No acute abnormalities.

## 2020-09-17 DIAGNOSIS — M5416 Radiculopathy, lumbar region: Secondary | ICD-10-CM | POA: Diagnosis not present

## 2020-09-17 DIAGNOSIS — M5459 Other low back pain: Secondary | ICD-10-CM | POA: Diagnosis not present

## 2020-09-17 DIAGNOSIS — R2681 Unsteadiness on feet: Secondary | ICD-10-CM | POA: Diagnosis not present

## 2020-09-17 DIAGNOSIS — M1711 Unilateral primary osteoarthritis, right knee: Secondary | ICD-10-CM | POA: Diagnosis not present

## 2020-09-22 DIAGNOSIS — R2681 Unsteadiness on feet: Secondary | ICD-10-CM | POA: Diagnosis not present

## 2020-09-22 DIAGNOSIS — M5416 Radiculopathy, lumbar region: Secondary | ICD-10-CM | POA: Diagnosis not present

## 2020-09-22 DIAGNOSIS — M5459 Other low back pain: Secondary | ICD-10-CM | POA: Diagnosis not present

## 2020-09-22 DIAGNOSIS — M1711 Unilateral primary osteoarthritis, right knee: Secondary | ICD-10-CM | POA: Diagnosis not present

## 2020-09-24 DIAGNOSIS — M5459 Other low back pain: Secondary | ICD-10-CM | POA: Diagnosis not present

## 2020-09-24 DIAGNOSIS — M5416 Radiculopathy, lumbar region: Secondary | ICD-10-CM | POA: Diagnosis not present

## 2020-09-24 DIAGNOSIS — R2681 Unsteadiness on feet: Secondary | ICD-10-CM | POA: Diagnosis not present

## 2020-09-24 DIAGNOSIS — M1711 Unilateral primary osteoarthritis, right knee: Secondary | ICD-10-CM | POA: Diagnosis not present

## 2020-10-13 DIAGNOSIS — M1711 Unilateral primary osteoarthritis, right knee: Secondary | ICD-10-CM | POA: Diagnosis not present

## 2020-10-13 DIAGNOSIS — R2681 Unsteadiness on feet: Secondary | ICD-10-CM | POA: Diagnosis not present

## 2020-10-13 DIAGNOSIS — M5416 Radiculopathy, lumbar region: Secondary | ICD-10-CM | POA: Diagnosis not present

## 2020-10-13 DIAGNOSIS — M5459 Other low back pain: Secondary | ICD-10-CM | POA: Diagnosis not present

## 2020-10-15 DIAGNOSIS — M5416 Radiculopathy, lumbar region: Secondary | ICD-10-CM | POA: Diagnosis not present

## 2020-10-15 DIAGNOSIS — R2681 Unsteadiness on feet: Secondary | ICD-10-CM | POA: Diagnosis not present

## 2020-10-15 DIAGNOSIS — M1711 Unilateral primary osteoarthritis, right knee: Secondary | ICD-10-CM | POA: Diagnosis not present

## 2020-10-15 DIAGNOSIS — M5459 Other low back pain: Secondary | ICD-10-CM | POA: Diagnosis not present

## 2020-10-27 DIAGNOSIS — H6123 Impacted cerumen, bilateral: Secondary | ICD-10-CM | POA: Diagnosis not present

## 2020-11-05 DIAGNOSIS — Z23 Encounter for immunization: Secondary | ICD-10-CM | POA: Diagnosis not present

## 2020-11-09 DIAGNOSIS — R7309 Other abnormal glucose: Secondary | ICD-10-CM | POA: Diagnosis not present

## 2020-11-09 DIAGNOSIS — R634 Abnormal weight loss: Secondary | ICD-10-CM | POA: Diagnosis not present

## 2020-11-09 DIAGNOSIS — M159 Polyosteoarthritis, unspecified: Secondary | ICD-10-CM | POA: Diagnosis not present

## 2020-11-09 DIAGNOSIS — N952 Postmenopausal atrophic vaginitis: Secondary | ICD-10-CM | POA: Diagnosis not present

## 2020-11-09 DIAGNOSIS — Z23 Encounter for immunization: Secondary | ICD-10-CM | POA: Diagnosis not present

## 2020-11-09 DIAGNOSIS — I10 Essential (primary) hypertension: Secondary | ICD-10-CM | POA: Diagnosis not present

## 2020-11-09 DIAGNOSIS — E785 Hyperlipidemia, unspecified: Secondary | ICD-10-CM | POA: Diagnosis not present

## 2020-11-12 DIAGNOSIS — H353212 Exudative age-related macular degeneration, right eye, with inactive choroidal neovascularization: Secondary | ICD-10-CM | POA: Diagnosis not present

## 2020-11-12 DIAGNOSIS — H353221 Exudative age-related macular degeneration, left eye, with active choroidal neovascularization: Secondary | ICD-10-CM | POA: Diagnosis not present

## 2020-11-19 DIAGNOSIS — H401122 Primary open-angle glaucoma, left eye, moderate stage: Secondary | ICD-10-CM | POA: Diagnosis not present

## 2020-11-19 DIAGNOSIS — H16223 Keratoconjunctivitis sicca, not specified as Sjogren's, bilateral: Secondary | ICD-10-CM | POA: Diagnosis not present

## 2020-11-19 DIAGNOSIS — H353232 Exudative age-related macular degeneration, bilateral, with inactive choroidal neovascularization: Secondary | ICD-10-CM | POA: Diagnosis not present
